# Patient Record
Sex: Female | Born: 1990 | Race: White | Hispanic: No | Marital: Married | Smoking: Former smoker
Health system: Southern US, Community
[De-identification: ages and names within clinical notes are randomized; demographics above are authoritative.]

## PROBLEM LIST (undated history)

## (undated) DIAGNOSIS — Z8679 Personal history of other diseases of the circulatory system: Secondary | ICD-10-CM

## (undated) DIAGNOSIS — I499 Cardiac arrhythmia, unspecified: Secondary | ICD-10-CM

## (undated) DIAGNOSIS — N764 Abscess of vulva: Secondary | ICD-10-CM

## (undated) DIAGNOSIS — E162 Hypoglycemia, unspecified: Secondary | ICD-10-CM

## (undated) DIAGNOSIS — E282 Polycystic ovarian syndrome: Secondary | ICD-10-CM

## (undated) DIAGNOSIS — G43909 Migraine, unspecified, not intractable, without status migrainosus: Secondary | ICD-10-CM

## (undated) DIAGNOSIS — Q211 Atrial septal defect, unspecified: Secondary | ICD-10-CM

## (undated) DIAGNOSIS — F419 Anxiety disorder, unspecified: Secondary | ICD-10-CM

## (undated) DIAGNOSIS — I4891 Unspecified atrial fibrillation: Secondary | ICD-10-CM

## (undated) DIAGNOSIS — O24419 Gestational diabetes mellitus in pregnancy, unspecified control: Secondary | ICD-10-CM

## (undated) HISTORY — DX: Unspecified atrial fibrillation: I48.91

## (undated) HISTORY — DX: Anxiety disorder, unspecified: F41.9

## (undated) HISTORY — PX: ASD REPAIR: SHX258

## (undated) HISTORY — DX: Personal history of other diseases of the circulatory system: Z86.79

## (undated) HISTORY — DX: Gestational diabetes mellitus in pregnancy, unspecified control: O24.419

## (undated) HISTORY — DX: Cardiac arrhythmia, unspecified: I49.9

## (undated) HISTORY — DX: Migraine, unspecified, not intractable, without status migrainosus: G43.909

## (undated) HISTORY — DX: Atrial septal defect: Q21.1

## (undated) HISTORY — DX: Abscess of vulva: N76.4

## (undated) HISTORY — DX: Polycystic ovarian syndrome: E28.2

## (undated) HISTORY — DX: Hypoglycemia, unspecified: E16.2

## (undated) HISTORY — PX: HYMENECTOMY: SHX987

## (undated) HISTORY — PX: APPENDECTOMY: SHX54

## (undated) HISTORY — DX: Atrial septal defect, unspecified: Q21.10

---

## 2015-03-05 DIAGNOSIS — F5101 Primary insomnia: Secondary | ICD-10-CM | POA: Insufficient documentation

## 2015-03-05 DIAGNOSIS — K219 Gastro-esophageal reflux disease without esophagitis: Secondary | ICD-10-CM | POA: Insufficient documentation

## 2017-07-01 DIAGNOSIS — Q211 Atrial septal defect, unspecified: Secondary | ICD-10-CM | POA: Insufficient documentation

## 2017-07-07 DIAGNOSIS — I480 Paroxysmal atrial fibrillation: Secondary | ICD-10-CM | POA: Insufficient documentation

## 2017-07-08 DIAGNOSIS — I4891 Unspecified atrial fibrillation: Secondary | ICD-10-CM | POA: Insufficient documentation

## 2019-07-10 LAB — OB RESULTS CONSOLE ANTIBODY SCREEN: Antibody Screen: NEGATIVE

## 2019-07-10 LAB — OB RESULTS CONSOLE GC/CHLAMYDIA
Chlamydia: NEGATIVE
Gonorrhea: NEGATIVE

## 2019-07-10 LAB — OB RESULTS CONSOLE HEPATITIS B SURFACE ANTIGEN: Hepatitis B Surface Ag: NEGATIVE

## 2019-07-10 LAB — OB RESULTS CONSOLE RUBELLA ANTIBODY, IGM: Rubella: IMMUNE

## 2019-07-10 LAB — OB RESULTS CONSOLE HIV ANTIBODY (ROUTINE TESTING): HIV: NONREACTIVE

## 2019-07-10 LAB — OB RESULTS CONSOLE ABO/RH: RH Type: POSITIVE

## 2019-07-10 LAB — OB RESULTS CONSOLE RPR: RPR: NONREACTIVE

## 2019-07-13 ENCOUNTER — Encounter (HOSPITAL_COMMUNITY): Payer: Self-pay | Admitting: *Deleted

## 2019-07-17 ENCOUNTER — Other Ambulatory Visit: Payer: Self-pay | Admitting: Obstetrics and Gynecology

## 2019-07-17 ENCOUNTER — Ambulatory Visit (HOSPITAL_COMMUNITY)
Payer: Federal, State, Local not specified - PPO | Attending: Obstetrics and Gynecology | Admitting: Obstetrics and Gynecology

## 2019-07-17 ENCOUNTER — Other Ambulatory Visit: Payer: Self-pay

## 2019-07-17 VITALS — Ht 65.25 in

## 2019-07-17 DIAGNOSIS — O99419 Diseases of the circulatory system complicating pregnancy, unspecified trimester: Secondary | ICD-10-CM

## 2019-07-17 DIAGNOSIS — O99411 Diseases of the circulatory system complicating pregnancy, first trimester: Secondary | ICD-10-CM | POA: Diagnosis not present

## 2019-07-17 DIAGNOSIS — Z3A13 13 weeks gestation of pregnancy: Secondary | ICD-10-CM

## 2019-07-17 DIAGNOSIS — I471 Supraventricular tachycardia: Secondary | ICD-10-CM | POA: Diagnosis not present

## 2019-07-17 DIAGNOSIS — I270 Primary pulmonary hypertension: Secondary | ICD-10-CM

## 2019-07-17 NOTE — Progress Notes (Signed)
Maternal-Fetal Medicine  Name: Jennifer Villanueva MRN: [id number] Requesting Provider: Dr. Tiana Loft, MD  I had the pleasure of seeing Jennifer Villanueva today at [facility]. She was accompanied by her husband. She is here for consultation because of maternal cardiac condition.  In May 2018, the patient "passed out" twice and was admitted to the local hospital for evaluation. Echocardiography showed pulmonary hypertension, patent foramen ovale (PFO)and increased right ventricular size. She had PFO repair in July 2018 and following surgery, she had atrial fibrillation. She had cardioversion (2018).  She is being followed by [facility] [place] (Cicero Duck, NP). She was seen by Ms. Britta Mccreedy on 07/13/19. Echocardiography performed on 11/13/2018 showed the following important findings: -The right ventricle has normal size and function. -LV size is normal. EF greater than 55%. -The tricuspid valve is structurally normal. There is moderate tricuspid insufficiency (2+). There is mild pulmonary hypertension with a RSVP of 36 mm Hg.   Patient reports she does NOT have pulmonary hypertension and that it has resolved. I spoke to Ms. Britta Mccreedy before consultation and she feels it has resolved after surgery. Echocardiography seems to conflict with the opinion.  Review of systems: Mild headaches off and on (less frequent migraines now), no visual distubances, no epistaxis or ear discharge or neck swelling (recent thyroid function is normal). No chest pain, occasional palpitations with quick resolution, no abdominal or joint pains, no vaginal bleeding, no recurrent urinary tract infections, no syncopal episodes. Patient is able to tolerate moderate activities without shortness of breath. She does not use treadmill and avoids climbing stairs.  PSH: PFO repair (2018), apeendectomy (2006), hymenectomy (1996) because of urinary complaints.  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 Allergies: Codeine (hives), ?triple antibiotic cream, coconut. Social: Ex-smoker (quit more than a year ago), does not drink alcohol or use drugs. She has been married since October 2018 and her husband is in good health.  Gyn: PCOS before pregnancy, had Mirena IUD removed with an aim to achieve pregnancy (approved by her cardiologist).  Ob history: G1 P0.   I counseled the patient on the following: Mild pulmonary hypertension: -I informed the patient that I must assume she has mild pulmonary hypertension based on echocardiography report. Although her cardiologist had mentioned it resolved, hypertensive changes could remain after corrective surgery (PFO repair). I strongly recommended a second opinion by another cardiologist to be sure about the diagnosis as pulmonary hypertension in pregnancy is associated with increased maternal mortality and morbidity.  -I informed her that my counseling on pulmonary hypertension will help her decide on her choice of an institution for pregnancy management and delivery.  -Discussed cardiac anatomy and circulation with help of google images.  -Pregnancy imposes additional burden on maternal cardiac function. There is increased stroke volume, cardiac output and later (second and third trimester) increased heart rate. Cardiac output further increases during labor. -Maternal plasma volume increases by about 40%.  -Explained in simple terms the changes that occur with pulmonary hypertension. Pulmonary vascular pressure increases with increase in cardiac output. The pulmonary vessels undergo thickening and are usually permanent.  -Causes include congenital heart defect including PFO (group 1).  -Increased risk of thrombosis and pulmonary embolism  occur.  -Pregnancy can lead to increased pulmonary pressure, pulmonary edema and death.  -Previous studies have quoted mortality  rate higher than 50%.  -Recent reviews showed mortality ranges from 10% to 25%.  -Risk of maternal death cannot be predicted on the severity of pulmonary hypertension. Mild pulmonary hypertension can also have worse outcomes. Overall, one would expect lower mortality rate with mild pulmonary hypertension (10%).  -ACOG and other professional societies ([place] advise against pregnancy in patients with pulmonary hypertension and that termination of pregnancy should be considered.  -Pulmonary hypertension is Class IV (pregnancy is contraindicated) under Modified WHO Pregnancy Risk Classification. -If pregnancy is continued, it should be closely monitored and managed by multidisciplinary team involving obstetrician, maternal-fetal medicine specialist, cardiologist, anesthesiologist (with cardiac experience)/ICU team.  -Targeted treatment includes use of prostanoids, calcium channel blockers, heparin and they may have to be started 1 to 3 months before expected delivery. I will defer this option to the cardiologist.  -Early planned delivery may be necessary in many cases (34 weeks or earlier) and cesarean section may have to be performed. In asymptomatic cases, the pregnancy may continue up to 37 weeks.  -General anesthesia is to be avoided and spinal/epidural anesthesia is safe.  -Case reviews have shown that most deaths occur in the postpartum period (>80%) and can take several weeks.   -Close observation including ICU admission may be necessary in some cases after delivery.  -Fortunately, the patient does not have co-morbid conditions including diabetes or hypertension.  Atrial fibrillation and Supraventricular tachycardia: Patient had only 2 episodes in 2018 of A-Fib after surgery. Last week, at her office visit, she had SVT. She feels well now. I discussed the  safety-profile of propranolol. Fetal growth restriction can be associated with this drug, but overall it can be safely administered in pregnancy.   It is unclear whether pregnancy increases the frequency of SVT and the studies are conflicting. However, if SVT occurs in pregnancy, they are most-likely to be symptomatic. Digoxin, metoprolol are commonly-used drugs that can be safely given in pregnancy. Flecainide and sotolol may be required in some cases and can be given in pregnancy.  IV metoprolol should be available during labor. Institutional protocol should be consulted. If SVT occurs and not amenable to vagal maneuvers, IV metoprolol 2.5 mg over 5 minutes can be given (not more than 5 mg). Cardiology consultation should be obtained and dosage regimen should be available for emergency use by obstetricians.  Medications: I discussed the safety profiles of medications. Amitriptyline is not well-studied in pregnancy. If alternative medication is not possible, it may be continued.  Patient agreed to have a second opinion by another cardiologist. I discussed with Dr. Amado NashAlmquist after consultation and she will make arrangements for cardiology consultation.  Patient seems keen to continue her pregnancy and is likely to firm up her decision after cardiology consultation. She expressed her wish to be transferred [facility] [place] if cannot deliver [facility].  Recommendations: -Cardiology (second opinion) consultation for diagnosis (or rule out) of pulmonary hypertension. -Decision to [facility] or transfer of care to be made after cardiology consultation. -Recommend prophylactic lovenox if pulmonary hypertension is confirmed. Lovenox 60 milligrams subcut once daily may be given till 36 weeks and then can be switched to unfractionated heparin. -Continue medications as per cardiologist's advice.  Thank you for your consult. Please do not hesitate to contact me if you have any questions or  concerns.   Consultation including face-to-face counseling: 50 min.

## 2019-07-19 ENCOUNTER — Ambulatory Visit (INDEPENDENT_AMBULATORY_CARE_PROVIDER_SITE_OTHER): Payer: Federal, State, Local not specified - PPO | Admitting: Cardiology

## 2019-07-19 ENCOUNTER — Encounter: Payer: Self-pay | Admitting: Cardiology

## 2019-07-19 ENCOUNTER — Other Ambulatory Visit: Payer: Self-pay

## 2019-07-19 ENCOUNTER — Ambulatory Visit: Payer: Self-pay | Admitting: Cardiology

## 2019-07-19 VITALS — BP 134/86 | HR 111 | Ht 65.0 in | Wt 260.0 lb

## 2019-07-19 DIAGNOSIS — I480 Paroxysmal atrial fibrillation: Secondary | ICD-10-CM | POA: Diagnosis not present

## 2019-07-19 DIAGNOSIS — Z8774 Personal history of (corrected) congenital malformations of heart and circulatory system: Secondary | ICD-10-CM

## 2019-07-19 DIAGNOSIS — R000 Tachycardia, unspecified: Secondary | ICD-10-CM | POA: Diagnosis not present

## 2019-07-19 DIAGNOSIS — Z3A12 12 weeks gestation of pregnancy: Secondary | ICD-10-CM | POA: Diagnosis not present

## 2019-07-19 NOTE — Progress Notes (Signed)
Primary Physician:  Petra KubaAlli, Foluke M, MD   Patient ID: Jennifer Villanueva League, female    DOB: 09/24/1991, 28 y.o.   MRN: [id number]  Subjective:    Chief Complaint  Patient presents with  . Hypertension  . New Patient (Initial Visit)    HPI: Jennifer Villanueva Nannini  is a 28 y.o. female  with paroxysmal atrial fibrillation/atrial flutter, s/p ASD repair in July 2018, questionable pulmonary hypertension, chronic migraine, currently [redacted] weeks pregnant, referred to us on STAT basis by her OBGYN, Dr. Amado NashAlmquist for second opinion on pulmonary hypertension.   She is currently followed by [facility] (Dr. Wende BushyKreshon and Georgia DuffMelissa Benson, NP). On 06/30/2017 patient [facility], found after episodes of syncope and underwent cardiac MRI noting her ASD. She had a uncomplicated postoperative course, except for a few days later developed A fib that required cardioversion on 07/08/2018. She has only had 2 episodes of A fib, no episodes for awhile. She was also evaluated by [place] in [place], that cleared her.  She had echocardiogram after her procedure revealing preserved LVEF, flattened septum consistent with RV pressure/volume overload.  No residual ASD shunt.  Right ventricle and right atrium were severely dilated along with moderate tricuspid regurgitation.  Sometime after this, she apparently developed A. fib.  She was on anticoagulation for a period of time that has been stopped for some time. She was loaded with amiodarone and underwent cardioversion on 07/08/2018; however, she eventually went back into atrial flutter/atrial fibrillation did not resolve with increased dose of metoprolol.  She was continued on amiodarone.  Metoprolol was switched to Inderal; however, required low dose due to hypotension.  Sometime later, amiodarone was discontinued and she was using propafenone for "pill in the pocket" approach.  She continued to have tachypalpitations with correlation on Holter monitor to sinus  tachycardia.  She is followed by cardiology and underwent echocardiogram in 2019 revealing normal LVEF, normal RV size and function.  Moderate TR and mild pulmonary hypertension with RSVP of 36 mmHg.  She recently had an episode of SVT in late July and was seen by her cardiologist.  Propanolol was changed to 3 times daily dosing for better rate control.  She was encouraged to not use Propafenone.  Due to concerns by her OB/GYN for pulmonary hypertension, she had requested second opinion.  She has not noticed any significant shortness of breath. She has been gaining weight since 2018 and is having her thyroid evaluated as well. She has insomnia, but no history of sleep apnea. No leg edema.   She previously on Propanolol 40 mg BID; however, had lightheadedness with this. She is now on Propanolol 20 mg TID that she tolerates well.   Past Medical History:  Diagnosis Date  . Anxiety   . ASD (atrial septal defect)   . Atrial fibrillation (HCC)   . History of pulmonary hypertension   . Hypoglycemia   . Migraine   . PCOS (polycystic ovarian syndrome)     Past Surgical History:  Procedure Laterality Date  . APPENDECTOMY    . ASD REPAIR    . HYMENECTOMY      Social History   Socioeconomic History  . Marital status: Married    Spouse name: Not on file  . Number of children: 0  . Years of education: Not on file  . Highest education level: Not on file  Occupational History  . Not on file  Social Needs  . Financial resource strain: Not on file  .  Food insecurity    Worry: Not on file    Inability: Not on file  . Transportation needs    Medical: Not on file    Non-medical: Not on file  Tobacco Use  . Smoking status: Former Smoker    Packs/day: 0.50    Years: 3.00    Pack years: 1.50    Types: Cigarettes    Quit date: 2018    Years since quitting: 2.6  . Smokeless tobacco: Never Used  Substance and Sexual Activity  . Alcohol use: Not Currently  . Drug use: Never  . Sexual  activity: Not on file  Lifestyle  . Physical activity    Days per week: Not on file    Minutes per session: Not on file  . Stress: Not on file  Relationships  . Social Herbalist on phone: Not on file    Gets together: Not on file    Attends religious service: Not on file    Active member of club or organization: Not on file    Attends meetings of clubs or organizations: Not on file    Relationship status: Not on file  . Intimate partner violence    Fear of current or ex partner: Not on file    Emotionally abused: Not on file    Physically abused: Not on file    Forced sexual activity: Not on file  Other Topics Concern  . Not on file  Social History Narrative  . Not on file    Review of Systems  Constitution: Positive for weight gain. Negative for decreased appetite, malaise/fatigue and weight loss.  Eyes: Negative for visual disturbance.  Cardiovascular: Negative for chest pain, claudication, dyspnea on exertion, leg swelling, orthopnea, palpitations and syncope.  Respiratory: Negative for hemoptysis and wheezing.   Endocrine: Negative for cold intolerance and heat intolerance.  Hematologic/Lymphatic: Does not bruise/bleed easily.  Skin: Negative for nail changes.  Musculoskeletal: Negative for muscle weakness and myalgias.  Gastrointestinal: Negative for abdominal pain, change in bowel habit, nausea and vomiting.  Neurological: Negative for difficulty with concentration, dizziness, focal weakness and headaches.  Psychiatric/Behavioral: Negative for altered mental status and suicidal ideas.  All other systems reviewed and are negative.     Objective:  Blood pressure 134/86, pulse (!) 111, height 5\' 5"  (1.651 m), weight 260 lb (117.9 kg), SpO2 100 %. Body mass index is 43.27 kg/m.    Physical Exam  Constitutional: She is oriented to person, place, and time. Vital signs are normal. She appears well-developed and well-nourished.  HENT:  Head: Normocephalic and  atraumatic.  Neck: Normal range of motion.  Cardiovascular: Normal rate, regular rhythm, normal heart sounds and intact distal pulses.  Pulmonary/Chest: Effort normal and breath sounds normal. No accessory muscle usage. No respiratory distress.  Abdominal: Soft. Bowel sounds are normal.  Musculoskeletal: Normal range of motion.  Neurological: She is alert and oriented to person, place, and time.  Skin: Skin is warm and dry.  Vitals reviewed.  Radiology: No results found.  Laboratory examination:    No flowsheet data found. No flowsheet data found. Lipid Panel  No results found for: CHOL, TRIG, HDL, CHOLHDL, VLDL, LDLCALC, LDLDIRECT HEMOGLOBIN A1C No results found for: HGBA1C, MPG TSH No results for input(s): TSH in the last 8760 hours.  PRN Meds:. There are no discontinued medications. Current Meds  Medication Sig  . amitriptyline (ELAVIL) 75 MG tablet Take 75 mg by mouth at bedtime.  Marland Kitchen aspirin 81 MG chewable  tablet Chew 81 mg by mouth 2 (two) times daily.   Marland Kitchen. loratadine (CLARITIN) 10 MG tablet Take 10 mg by mouth daily.  . Omeprazole (PRILOSEC PO) Take 20 mg by mouth 2 (two) times daily.   . Prenatal Vit-Fe Fumarate-FA (MULTIVITAMIN-PRENATAL) 27-0.8 MG TABS tablet Take 1 tablet by mouth daily at 12 noon.  . propranolol (INDERAL) 20 MG tablet Take 20 mg by mouth 3 (three) times daily.    Cardiac Studies:   48 hour holter 05/21/2019: There were 280174 QRS complexes detected. Of these, there were 2(<1%) ventricular ectopic beats and 32(<1%) supraventricular ectopic beats. The average heart rate was 99 bpm. The heart rate ranged from 63 bpm to 154 bpm. The R-R ranged from 244 msec to 1576 msec. There were 275 sustained tachycardia episodes with total duration 1096.2(38.6%) minutes. The fastest tachy was 125 bpm. The longest tachy was 131 minutes. Preliminary technician interpretation: Sinus rhythm to sinus tachycardia with sinus arrhythmia. No diary events entered    Echo  11/11/2017: Fair quality study with normal LV systolic function, mild RV enlargement, no significant valvular disease, ASD repair appears to be intact  Assessment:     ICD-10-CM   1. Sinus tachycardia  R00.0   2. Paroxysmal atrial fibrillation (HCC)  I48.0   3. H/O congenital atrial septal defect (ASD) repair  Z87.74 EKG 12-Lead    PCV ECHOCARDIOGRAM COMPLETE W BUBBLE  4. [redacted] weeks gestation of pregnancy  Z3A.12     EKG 07/19/2019: Sinus tachycardia at 105 bpm, normal axis, IRBBB.       Recommendations:   She has not had any recurrence of A fib in the last 1-2 years; therefore, I do not feel that management would change. She reports episode of SVT while in her cardiologist office on 07/30; however, on reading their EKG interpretation, sinus tachycardia was noted. If she did actually have SVT, she may need antiarrhythmic therapy.   Less likely to be primary pulmonary hypertension. I suspect her pulmonary hypertension is secondary from congenital heart defect. She also has other reasons for pulmonary hypertension including her weight. She will continue with evaluation for thyroid disorder. Suspect may also be contributed by her propanolol.Discussed diet changes to help with this, encouraged her to be careful with her diet to avoid excessive weight gain during pregnancy.  If Pulm HTN is truly there and anything more than mild, do feel that she will need evaluation and management from tertiary center [facility] [place]. Patient is agreeable to this. I will certainly keep Dr. Wende BushyKreshon and his NP informed as well, and if she needs referral, I will defer to them as she has an established relationship with them. Symptomatically she is doing well. No clinical evidence of heart failure by exam. I will obtain echocardiogram with bubble study. If found to have residual shunting, will consider cardiac MRI. I will see her back in 4 weeks for follow up.    *I have discussed this case with Dr. Rosemary HolmsPatwardhan  and he personally examined the patient and participated in formulating the plan.*    Toniann FailAshton Haynes Blessing Ozga, MSN, APRN, FNP-C [facility] Office: [phone] Fax: [phone]

## 2019-08-02 ENCOUNTER — Other Ambulatory Visit: Payer: Self-pay

## 2019-08-02 ENCOUNTER — Ambulatory Visit (INDEPENDENT_AMBULATORY_CARE_PROVIDER_SITE_OTHER): Payer: Federal, State, Local not specified - PPO

## 2019-08-02 DIAGNOSIS — Z8774 Personal history of (corrected) congenital malformations of heart and circulatory system: Secondary | ICD-10-CM | POA: Diagnosis not present

## 2019-08-02 DIAGNOSIS — Z0189 Encounter for other specified special examinations: Secondary | ICD-10-CM | POA: Diagnosis not present

## 2019-08-09 NOTE — Telephone Encounter (Signed)
Spoke with patient she is aware of results but she says she cant see it online

## 2019-08-17 ENCOUNTER — Other Ambulatory Visit: Payer: Self-pay

## 2019-08-17 ENCOUNTER — Telehealth (INDEPENDENT_AMBULATORY_CARE_PROVIDER_SITE_OTHER): Payer: Federal, State, Local not specified - PPO | Admitting: Cardiology

## 2019-08-17 VITALS — HR 100 | Ht 65.0 in | Wt 258.0 lb

## 2019-08-17 DIAGNOSIS — Z3A17 17 weeks gestation of pregnancy: Secondary | ICD-10-CM | POA: Diagnosis not present

## 2019-08-17 DIAGNOSIS — Z8774 Personal history of (corrected) congenital malformations of heart and circulatory system: Secondary | ICD-10-CM

## 2019-08-17 DIAGNOSIS — I480 Paroxysmal atrial fibrillation: Secondary | ICD-10-CM | POA: Diagnosis not present

## 2019-08-17 DIAGNOSIS — R000 Tachycardia, unspecified: Secondary | ICD-10-CM

## 2019-08-17 NOTE — Progress Notes (Signed)
Primary Physician:  Kasandra Knudsen, MD   Patient ID: Thea Gist, female    DOB: Jul 24, 1991, 28 y.o.   MRN: [id number]  Subjective:    Chief Complaint  Patient presents with   Results    echo results   This visit type was conducted due to national recommendations for restrictions regarding the COVID-19 Pandemic (e.g. social distancing).  This format is felt to be most appropriate for this patient at this time.  All issues noted in this document were discussed and addressed.  No physical exam was performed (except for noted visual exam findings with Telehealth visits).  The patient has consented to conduct a Telehealth visit and understands insurance will be billed.   I discussed the limitations of evaluation and management by telemedicine and the availability of in person appointments. The patient expressed understanding and agreed to proceed.  Virtual Visit via Video Note is as below  I connected with Mrs. Eyerman, on 08/17/19 at 1430 by a video enabled telemedicine application and verified that I am speaking with the correct person using two identifiers.     I have discussed with her regarding the safety during COVID Pandemic and steps and precautions including social distancing with the patient.    HPI: Ellar Hakala  is a 28 y.o. female  with paroxysmal atrial fibrillation/atrial flutter, s/p ASD repair in July 2018, questionable pulmonary hypertension, chronic migraine, currently [redacted] weeks pregnant, recently evaluated by [place] for second opinion on pulmonary hypertension.   She is currently followed by [facility] (Dr. Venia Minks and Cicero Duck, NP). On 06/30/2017 patient underwent surgical ASD repair at [facility], found after episodes of syncope and underwent cardiac MRI noting her ASD. She had a uncomplicated postoperative course, except for a few days later developed A fib that required cardioversion on 07/08/2018. She has only had 2 episodes of A fib, no episodes for awhile.  She was also evaluated by EP in [place], that cleared her.  She had echocardiogram after her procedure revealing preserved LVEF, flattened septum consistent with RV pressure/volume overload.  No residual ASD shunt.  Right ventricle and right atrium were severely dilated along with moderate tricuspid regurgitation.  Sometime after this, she apparently developed A. fib.  She was on anticoagulation for a period of time that has been stopped for some time. She was loaded with amiodarone and underwent cardioversion on 07/08/2018; however, she eventually went back into atrial flutter/atrial fibrillation did not resolve with increased dose of metoprolol.  She was continued on amiodarone.  Metoprolol was switched to Inderal; however, required low dose due to hypotension.  Sometime later, amiodarone was discontinued and she was using propafenone for "pill in the pocket" approach.  She continued to have tachypalpitations with correlation on Holter monitor to sinus tachycardia.  She is followed by cardiology and underwent echocardiogram in 2019 revealing normal LVEF, normal RV size and function.  Moderate TR and mild pulmonary hypertension with RSVP of 36 mmHg.  She recently had an episode of SVT in late July and was seen by her cardiologist.  Propanolol was changed to 3 times daily dosing for better rate control.  She was encouraged to not use Propafenone.  Due to concerns by her OB/GYN for pulmonary hypertension, we were requested to see the patient. She underwent echocardiogram and now presents for follow up.  She states that she is doing well, no complications with pregnancy thus far. She was found to have gestational diabetes that she is able to control with diet. She  has not had any shortness of breath. Heart rate has been stable in the 90-100 range. No palpitations. She has insomnia, but no history of sleep apnea. No leg edema. Reports that she is actually losing weight during her pregnancy.   She previously on  Propanolol 40 mg BID; however, had lightheadedness with this. She is now on Propanolol 20 mg TID that she tolerates well.   Past Medical History:  Diagnosis Date   Anxiety    ASD (atrial septal defect)    Atrial fibrillation (HCC)    History of pulmonary hypertension    Hypoglycemia    Migraine    PCOS (polycystic ovarian syndrome)     Past Surgical History:  Procedure Laterality Date   APPENDECTOMY     ASD REPAIR     HYMENECTOMY      Social History   Socioeconomic History   Marital status: Married    Spouse name: Not on file   Number of children: 0   Years of education: Not on file   Highest education level: Not on file  Occupational History   Not on file  Social Needs   Financial resource strain: Not on file   Food insecurity    Worry: Not on file    Inability: Not on file   Transportation needs    Medical: Not on file    Non-medical: Not on file  Tobacco Use   Smoking status: Former Smoker    Packs/day: 0.50    Years: 3.00    Pack years: 1.50    Types: Cigarettes    Quit date: 2018    Years since quitting: 2.6   Smokeless tobacco: Never Used  Substance and Sexual Activity   Alcohol use: Not Currently   Drug use: Never   Sexual activity: Not on file  Lifestyle   Physical activity    Days per week: Not on file    Minutes per session: Not on file   Stress: Not on file  Relationships   Social connections    Talks on phone: Not on file    Gets together: Not on file    Attends religious service: Not on file    Active member of club or organization: Not on file    Attends meetings of clubs or organizations: Not on file    Relationship status: Not on file   Intimate partner violence    Fear of current or ex partner: Not on file    Emotionally abused: Not on file    Physically abused: Not on file    Forced sexual activity: Not on file  Other Topics Concern   Not on file  Social History Narrative   Not on file    Review  of Systems  Constitution: Positive for weight loss. Negative for decreased appetite, malaise/fatigue and weight gain.  Eyes: Negative for visual disturbance.  Cardiovascular: Negative for chest pain, claudication, dyspnea on exertion, leg swelling, orthopnea, palpitations and syncope.  Respiratory: Negative for hemoptysis and wheezing.   Endocrine: Negative for cold intolerance and heat intolerance.  Hematologic/Lymphatic: Does not bruise/bleed easily.  Skin: Negative for nail changes.  Musculoskeletal: Negative for muscle weakness and myalgias.  Gastrointestinal: Negative for abdominal pain, change in bowel habit, nausea and vomiting.  Neurological: Negative for difficulty with concentration, dizziness, focal weakness and headaches.  Psychiatric/Behavioral: Negative for altered mental status and suicidal ideas.  All other systems reviewed and are negative.     Objective:  Pulse 100, height 5\' 5"  (1.651  m), weight 258 lb (117 kg). Body mass index is 42.93 kg/m.    Physical Exam  Constitutional: She is oriented to person, place, and time. Vital signs are normal. She appears well-developed and well-nourished.  HENT:  Head: Normocephalic and atraumatic.  Neck: Normal range of motion.  Cardiovascular: Normal rate, regular rhythm, normal heart sounds and intact distal pulses.  Pulmonary/Chest: Effort normal and breath sounds normal. No accessory muscle usage. No respiratory distress.  Abdominal: Soft. Bowel sounds are normal.  Musculoskeletal: Normal range of motion.  Neurological: She is alert and oriented to person, place, and time.  Skin: Skin is warm and dry.  Vitals reviewed.  Radiology: No results found.  Laboratory examination:    No flowsheet data found. No flowsheet data found. Lipid Panel  No results found for: CHOL, TRIG, HDL, CHOLHDL, VLDL, LDLCALC, LDLDIRECT HEMOGLOBIN A1C No results found for: HGBA1C, MPG TSH No results for input(s): TSH in the last 8760  hours.  PRN Meds:. There are no discontinued medications. Current Meds  Medication Sig   amitriptyline (ELAVIL) 75 MG tablet Take 75 mg by mouth at bedtime.   aspirin 81 MG chewable tablet Chew 81 mg by mouth 2 (two) times daily.    loratadine (CLARITIN) 10 MG tablet Take 10 mg by mouth daily.   Omeprazole (PRILOSEC PO) Take 20 mg by mouth 2 (two) times daily.    Prenatal Vit-Fe Fumarate-FA (MULTIVITAMIN-PRENATAL) 27-0.8 MG TABS tablet Take 1 tablet by mouth daily at 12 noon.   propranolol (INDERAL) 20 MG tablet Take 20 mg by mouth 3 (three) times daily.    Cardiac Studies:   48 hour holter 05/21/2019: There were 280174 QRS complexes detected. Of these, there were 2(<1%) ventricular ectopic beats and 32(<1%) supraventricular ectopic beats. The average heart rate was 99 bpm. The heart rate ranged from 63 bpm to 154 bpm. The R-R ranged from 244 msec to 1576 msec. There were 275 sustained tachycardia episodes with total duration 1096.2(38.6%) minutes. The fastest tachy was 125 bpm. The longest tachy was 131 minutes. Preliminary technician interpretation: Sinus rhythm to sinus tachycardia with sinus arrhythmia. No diary events entered    Echo- 08/02/2019 1. Normal LV systolic function with EF 57%. Left ventricle cavity is normal in size. Normal global wall motion. Calculated EF 57%. 2. Left atrial cavity is mild to moderately dilated at 4.3 cm. Pt. is s/p ASD repair, there is minimal to mild residual shunt by color doppler study. Suggest bubble study or other imaging modality for further evaluation as clinically indicated. 3. Right ventricle cavity is mildly dilated. Normal right ventricular function. 4. Mild (Grade I) mitral regurgitation. 5. Mild tricuspid regurgitation. No evidence of pulmonary hypertension. Approx. PA systolic pressure is 27 mm of Hg. Assessment:     ICD-10-CM   1. Sinus tachycardia  R00.0   2. H/O congenital atrial septal defect (ASD) repair  Z87.74    3. Paroxysmal atrial fibrillation (HCC)  I48.0   4. [redacted] weeks gestation of pregnancy  Z3A.17     EKG 07/19/2019: Sinus tachycardia at 105 bpm, normal axis, IRBBB.       Recommendations:   Patient presents for follow up to discuss echocardiogram results. ASD repair appears intact with no shunting noted. Flow noted at the septum is normal IVC flow and does not depict interatrial shunting. Patient has mild mitral and mild tricuspid regurgitation, which is benign finding. Pulmonary artery pressure is 27 mmHg, which is reduced from previous echocardiogram in 11/2018, and is normal. No evidence  of pulmonary hypertension. She reports that her primary cardiologist is planning echocardiogram at 6 months gestation for follow up and surveillance.   She does continue to have borderline tachycardia with Propanolol, but unable to tolerate higher dose due to soft BP. Encouraged adequate hydration and exercise that can actually help with her heart rate. She has had uncomplicated pregnancy thus far, but was recently found to have gestational diabetes that she is able to manage with diet.   She has not had any recurrence of A fib in the last 1-2 years; therefore, I do not feel that management would change. She reports episode of SVT while in her cardiologist office on 07/30; however, on reading their EKG interpretation, sinus tachycardia was noted. If she did actually have SVT, she may need antiarrhythmic therapy. Will defer to her primary cardiology team at [facility]. Will see her back on a as needed basis for any new or worsening problems.   *I have discussed this case with Dr. Rosemary Holms and he personally examined the patient and participated in formulating the plan.*    Toniann Fail, MSN, APRN, FNP-C [facility] Office: [phone] Fax: [phone]

## 2019-09-24 ENCOUNTER — Other Ambulatory Visit: Payer: Self-pay | Admitting: Obstetrics and Gynecology

## 2019-09-25 ENCOUNTER — Other Ambulatory Visit: Payer: Self-pay | Admitting: Obstetrics and Gynecology

## 2019-10-16 ENCOUNTER — Other Ambulatory Visit: Payer: Self-pay | Admitting: Obstetrics and Gynecology

## 2019-10-17 ENCOUNTER — Other Ambulatory Visit (HOSPITAL_COMMUNITY): Payer: Self-pay | Admitting: Obstetrics and Gynecology

## 2019-10-17 DIAGNOSIS — Z363 Encounter for antenatal screening for malformations: Secondary | ICD-10-CM

## 2019-11-02 ENCOUNTER — Other Ambulatory Visit: Payer: Self-pay

## 2019-11-02 ENCOUNTER — Ambulatory Visit (HOSPITAL_COMMUNITY): Payer: Federal, State, Local not specified - PPO

## 2019-11-02 ENCOUNTER — Ambulatory Visit (HOSPITAL_COMMUNITY)
Admission: RE | Admit: 2019-11-02 | Discharge: 2019-11-02 | Disposition: A | Discharge status: Home / Self Care | Payer: Federal, State, Local not specified - PPO | Source: Ambulatory Visit | Attending: Obstetrics and Gynecology | Admitting: Obstetrics and Gynecology

## 2019-11-02 ENCOUNTER — Ambulatory Visit (HOSPITAL_COMMUNITY): Payer: Federal, State, Local not specified - PPO | Admitting: *Deleted

## 2019-11-02 ENCOUNTER — Encounter (HOSPITAL_COMMUNITY): Payer: Self-pay

## 2019-11-02 VITALS — BP 139/81 | HR 109 | Temp 97.6°F

## 2019-11-02 DIAGNOSIS — Z363 Encounter for antenatal screening for malformations: Secondary | ICD-10-CM | POA: Diagnosis not present

## 2019-11-02 DIAGNOSIS — O24410 Gestational diabetes mellitus in pregnancy, diet controlled: Secondary | ICD-10-CM | POA: Diagnosis not present

## 2019-11-02 DIAGNOSIS — O99213 Obesity complicating pregnancy, third trimester: Secondary | ICD-10-CM | POA: Diagnosis not present

## 2019-11-02 DIAGNOSIS — Z3A28 28 weeks gestation of pregnancy: Secondary | ICD-10-CM

## 2019-11-02 DIAGNOSIS — O288 Other abnormal findings on antenatal screening of mother: Secondary | ICD-10-CM

## 2019-11-02 NOTE — Consult Note (Addendum)
Maternal-Fetal Medicine (Follow-up Consultation)  Name: Jennifer Villanueva MRN: [id number] Requesting Provider: Dr. Tiana Loft, MD  I had the pleasure of seeing Ms. Finger today at the [facility] for maternal fetal care.  She had MFM consultation on 07/17/2019 because of maternal cardiac disease.  After our consultation, the patient was seen by cardiologist in [facility] (Dr. Einar Gip).  Maternal echocardiography performed on 08/03/2019 showed normal ejection fraction and no evidence of pulmonary hypertension.  Patient does not have symptoms of shortness of breath or chest pain.  On screening, she has gestational diabetes and patient reports that her diabetes is well controlled on diet. Her prenatal course has, otherwise, been uneventful.  On cell free fetal DNA screening, the risks of fetal aneuploidies are not increased.  MSAFP screening showed low risk for open neural tube defects.  On today's ultrasound, amniotic fluid is normal good fetal activity seen.  Fetal growth is appropriate for gestational age.  Fetal anatomy appears normal but limited by advanced gestational age.  I reassured the patient of today's ultrasound findings.  I counseled the patient on the following: Maternal cardiac condition: In the absence of pulmonary hypertension, we would expect good maternal outcomes.  I do not expect third trimester or labor complications.  Patient can have vaginal delivery and early epidural analgesia may be helpful.  Adequate descent of the head should be allowed to achieve vaginal delivery forceps or vacuum should not be applied routinely. It is reasonable to deliver at 39 to 40 weeks. Patient has a follow-up with her cardiologist and for a repeat echocardiography.  Gestational diabetes: I briefly discussed diabetes in pregnancy.  Well-controlled diabetes not associated with adverse fetal or neonatal outcomes.  If blood glucose levels are normal, she can check her glucose levels twice a week  instead of daily.  Fetal growth assessment should be performed every 4 weeks till delivery.  Recommendations: -Follow-up with cardiologist. -Fetal growth assessment every 4 weeks till delivery. -Delivery at [facility]. -Early epidural analgesia. -Postpartum screening for type 2 diabetes.  Thank you for your consult. Please do not hesitate to contact me if you have any questions or concerns.  Consultation including face-to-face counseling: 30 min

## 2019-12-11 ENCOUNTER — Other Ambulatory Visit: Payer: Self-pay | Admitting: Obstetrics and Gynecology

## 2020-01-02 ENCOUNTER — Encounter (HOSPITAL_COMMUNITY): Payer: Self-pay | Admitting: Obstetrics and Gynecology

## 2020-01-02 ENCOUNTER — Inpatient Hospital Stay (HOSPITAL_COMMUNITY)
Admission: AD | Admit: 2020-01-02 | Discharge: 2020-01-02 | Disposition: A | Discharge status: Home / Self Care | Payer: Federal, State, Local not specified - PPO | Attending: Obstetrics and Gynecology | Admitting: Obstetrics and Gynecology

## 2020-01-02 DIAGNOSIS — Z3A36 36 weeks gestation of pregnancy: Secondary | ICD-10-CM | POA: Diagnosis not present

## 2020-01-02 DIAGNOSIS — Z3689 Encounter for other specified antenatal screening: Secondary | ICD-10-CM | POA: Insufficient documentation

## 2020-01-02 DIAGNOSIS — Z3A37 37 weeks gestation of pregnancy: Secondary | ICD-10-CM | POA: Diagnosis not present

## 2020-01-02 NOTE — Discharge Instructions (Signed)
Fetal Monitoring Overview Monitoring your developing baby (fetus) before birth can identify potential problems for you and your baby. Fetal monitoring can:  Help prevent serious problems from developing.  Guide health care providers in how best to care for your unborn baby.  Check your unborn baby's overall health. The amount of monitoring and the specific tests that will be done will vary. It will depend on whether your pregnancy is considered to be low or high risk. For example, you may need certain tests if you have a medical problem that can put your unborn baby at risk. Health care providers use several techniques and tests to monitor your baby before birth. No single test is perfectly accurate, and you may need to follow up with your health care provider if there are any concerns. Fetal movement counts When you are past 20 weeks of pregnancy, you may feel your baby move. As your baby gets bigger, these movements are easier to feel. A fetal movement count is when you count the number of movements (kicks, flutters, swishes, rolls, or jabs) over a specific period of time. You record the number and report it to your health care provider. This is often recommended in high-risk pregnancies, but it is good for every pregnant woman to do. Your health care provider may ask you to start counting fetal movements at 28 weeks of pregnancy. Non-stress test The non-stress test:  Evaluates fetal heartbeat: ? While your baby is at rest. ? While your baby is moving.  Is often done as part of a set of tests called the biophysical profile.  May show signs that it is time for your baby to be delivered. The heart rate in a healthy baby will speed up when the baby moves or kicks. The heart rate will decrease at rest, and the peaks or accelerations of the heart rate will be lower. If the test brings up any questions or concerns, you may need to have more testing. This test may be done if your pregnancy goes  past your due date. It is also commonly done in high-risk pregnancies beginning at 32 weeks. Biophysical profile The biophysical profile is a set of tests that are done to find out how well the baby is doing. It includes the non-stress test along with imaging tests that use sound waves (ultrasound) to create images of your baby. In a biophysical profile, the following tests are done and scored:  Fetal heartbeat.  Fetal breathing.  Fetal movements.  Fetal muscle tone.  Amount of amniotic fluid. Each test gets a score of either 2 (normal) or 0 (abnormal). The scores are then added together for a total score. A low score may mean that you and your baby need additional monitoring or special care. Sometimes your health care provider may recommend that you deliver early. This test is usually done after 32 weeks of pregnancy. It can be done sooner, if needed. Contraction stress test A contraction stress test monitors the heart rate of your baby during contractions. The test checks to see how your baby will tolerate the stress of labor. Health care providers use this test to further evaluate your baby when other tests, such as the biophysical profile, have shown that there may be a problem. This test may be done:  Any time you are in labor.  Between weeks 32 and 34 of your pregnancy.  Later, if necessary. Modified biophysical profile A modified biophysical profile is a two-part test. You will have:  An ultrasound exam,   to check how much fluid is surrounding your baby inside of your womb (amniotic fluid).  A non-stress test, to check your baby's heart rate. The results will determine whether a full biophysical profile may be needed. This test is usually done late in your final 3 months (third trimester) of pregnancy. Umbilical artery Doppler velocimetry Umbilical artery Doppler velocimetry uses sound waves to measure the flow of blood between you and your baby. This test:  Measures the amount  of blood flow through the cord that attaches your baby to your womb (umbilical cord).  Measures the speed of the blood flow. You likely only need this test to check your baby's condition if you have a high-risk pregnancy. All types of monitoring aim to protect your health and that of your baby. The best way to have a healthy pregnancy and a healthy baby is to learn as much as you can about your pregnancy and to work closely with all your health care providers. This information is not intended to replace advice given to you by your health care provider. Make sure you discuss any questions you have with your health care provider. Document Revised: 08/23/2017 Document Reviewed: 06/28/2016 Elsevier Patient Education  2020 Elsevier Inc.  

## 2020-01-02 NOTE — MAU Provider Note (Signed)
History     CSN: [id number]  Arrival date and time: 01/02/20 1651   First Provider Initiated Contact with Patient 01/02/20 1824      Chief Complaint  Patient presents with  . fetal monitoring   HPI  Ms.  Jennifer Villanueva is a 29 y.o. year old G1P0 female at [redacted]w[redacted]d weeks gestation who was sent to MAU for further CEFM for a suspected fetal heart rate deceleration on an in-office NST. She seems to think "the baby moved while being monitored and the tracing wasn't picking up on his heartbeat at times." She denies any pain, VB, or LOF. She was having a NST in the office d/t GDM. She receives [facility] with [facility]. Her next scheduled appt is on 01/11/2020.  Past Medical History:  Diagnosis Date  . Anxiety   . ASD (atrial septal defect)   . Atrial fibrillation (HCC)   . History of pulmonary hypertension   . Hypoglycemia   . Migraine   . PCOS (polycystic ovarian syndrome)     Past Surgical History:  Procedure Laterality Date  . APPENDECTOMY    . ASD REPAIR    . HYMENECTOMY      History reviewed. No pertinent family history.  Social History   Tobacco Use  . Smoking status: Former Smoker    Packs/day: 0.50    Years: 3.00    Pack years: 1.50    Types: Cigarettes    Quit date: 2018    Years since quitting: 3.0  . Smokeless tobacco: Never Used  Substance Use Topics  . Alcohol use: Not Currently  . Drug use: Never    Allergies:  Allergies  Allergen Reactions  . Cephalosporins   . Coconut Flavor   . Codeine   . Triple Antibiotic [Bacitracin-Neomycin-Polymyxin]     Medications Prior to Admission  Medication Sig Dispense Refill Last Dose  . aspirin 81 MG chewable tablet Chew 81 mg by mouth 2 (two) times daily.    01/02/2020 at Unknown time  . clindamycin (CLEOCIN) 300 MG capsule Take 300 mg by mouth daily.   01/02/2020 at Unknown time  . loratadine (CLARITIN) 10 MG tablet Take 10 mg by mouth daily.   01/02/2020 at Unknown time  . Pantoprazole Sodium (PROTONIX PO) Take by mouth.    01/02/2020 at Unknown time  . Prenatal Vit-Fe Fumarate-FA (MULTIVITAMIN-PRENATAL) 27-0.8 MG TABS tablet Take 1 tablet by mouth daily at 12 noon.   01/02/2020 at Unknown time  . propranolol (INDERAL) 20 MG tablet Take 20 mg by mouth 3 (three) times daily.   01/02/2020 at Unknown time  . amitriptyline (ELAVIL) 75 MG tablet Take 75 mg by mouth at bedtime.     . Omeprazole (PRILOSEC PO) Take 20 mg by mouth 2 (two) times daily.        Review of Systems  Constitutional: Negative.   HENT: Negative.   Eyes: Negative.   Respiratory: Negative.   Cardiovascular: Negative.   Gastrointestinal: Negative.   Endocrine: Negative.   Genitourinary: Negative.   Musculoskeletal: Negative.   Skin: Negative.   Allergic/Immunologic: Negative.   Neurological: Negative.   Hematological: Negative.   Psychiatric/Behavioral: Negative.    Physical Exam   Blood pressure 123/73, pulse 88, temperature 99 F (37.2 C), resp. rate 16, height 5\' 5"  (1.651 m), weight 117.5 kg, SpO2 99 %.  Physical Exam  Nursing note and vitals reviewed. Constitutional: She is oriented to person, place, and time. She appears well-developed and well-nourished.  HENT:  Head: Normocephalic and atraumatic.  Eyes: Pupils are equal, round, and reactive to light.  Cardiovascular: Normal rate.  Respiratory: Effort normal.  GI: Soft.  Genitourinary:    Genitourinary Comments: Pelvic not indicated   Musculoskeletal:        General: Normal range of motion.     Cervical back: Normal range of motion.  Neurological: She is alert and oriented to person, place, and time.  Skin: Skin is warm and dry.  Psychiatric: She has a normal mood and affect. Her behavior is normal. Judgment and thought content normal.   NST - FHR: 140 bpm / moderate variability / accels present / decels absent / TOCO: Occ UI Noted, but no patient perception of them  MAU Course  Procedures  MDM CEFM  Assessment and Plan  NST (non-stress test) reactive on fetal  surveillance - Information provided on fetal monitoring  - Reassurance given that FM is very active - Advised to keep scheduled appointment on 01/11/2020 - Note faxed to [place] office - Discharge home - Patient verbalized an understanding of the plan of care and agrees.   Laury Deep, MSN, CNM 01/02/2020, 6:24 PM

## 2020-01-02 NOTE — MAU Note (Signed)
.   Jennifer Villanueva is a 29 y.o. at [redacted]w[redacted]d here in MAU reporting: she was sent from office for fetal monitoring due to dcel in the office. Pt states she thinks he moved and was not tracing at times at the office. Denies any Pain LMP:  Onset of complaint: office today Pain score: 0 Vitals:   01/02/20 1713  BP: 123/73  Pulse: 88  Resp: 16  Temp: 99 F (37.2 C)  SpO2: 99%     FHT:150 Lab orders placed from triage:

## 2020-01-07 LAB — OB RESULTS CONSOLE GBS: GBS: POSITIVE

## 2020-01-14 ENCOUNTER — Encounter (HOSPITAL_COMMUNITY): Payer: Self-pay | Admitting: *Deleted

## 2020-01-14 ENCOUNTER — Telehealth (HOSPITAL_COMMUNITY): Payer: Self-pay | Admitting: *Deleted

## 2020-01-14 NOTE — Telephone Encounter (Signed)
Preadmission screen  

## 2020-01-18 ENCOUNTER — Other Ambulatory Visit: Payer: Self-pay | Admitting: Obstetrics and Gynecology

## 2020-01-19 ENCOUNTER — Other Ambulatory Visit (HOSPITAL_COMMUNITY)
Admission: RE | Admit: 2020-01-19 | Discharge: 2020-01-19 | Disposition: A | Discharge status: Home / Self Care | Payer: Federal, State, Local not specified - PPO | Source: Ambulatory Visit | Attending: Obstetrics and Gynecology | Admitting: Obstetrics and Gynecology

## 2020-01-19 DIAGNOSIS — Z01812 Encounter for preprocedural laboratory examination: Secondary | ICD-10-CM | POA: Insufficient documentation

## 2020-01-19 DIAGNOSIS — Z20822 Contact with and (suspected) exposure to covid-19: Secondary | ICD-10-CM | POA: Insufficient documentation

## 2020-01-19 LAB — SARS CORONAVIRUS 2 (TAT 6-24 HRS): SARS Coronavirus 2: NEGATIVE

## 2020-01-20 ENCOUNTER — Other Ambulatory Visit: Payer: Self-pay

## 2020-01-21 ENCOUNTER — Encounter (HOSPITAL_COMMUNITY): Payer: Self-pay | Admitting: Obstetrics and Gynecology

## 2020-01-21 ENCOUNTER — Inpatient Hospital Stay (HOSPITAL_COMMUNITY)
Admission: RE | Admit: 2020-01-21 | Discharge: 2020-01-24 | DRG: 786 | Disposition: A | Discharge status: Home / Self Care | Payer: Federal, State, Local not specified - PPO | Attending: Obstetrics and Gynecology | Admitting: Obstetrics and Gynecology

## 2020-01-21 ENCOUNTER — Inpatient Hospital Stay (HOSPITAL_COMMUNITY): Payer: Federal, State, Local not specified - PPO | Admitting: Anesthesiology

## 2020-01-21 ENCOUNTER — Inpatient Hospital Stay (HOSPITAL_COMMUNITY): Payer: Federal, State, Local not specified - PPO

## 2020-01-21 DIAGNOSIS — D62 Acute posthemorrhagic anemia: Secondary | ICD-10-CM | POA: Diagnosis not present

## 2020-01-21 DIAGNOSIS — O9962 Diseases of the digestive system complicating childbirth: Secondary | ICD-10-CM | POA: Diagnosis present

## 2020-01-21 DIAGNOSIS — O24420 Gestational diabetes mellitus in childbirth, diet controlled: Secondary | ICD-10-CM | POA: Diagnosis present

## 2020-01-21 DIAGNOSIS — Z87891 Personal history of nicotine dependence: Secondary | ICD-10-CM | POA: Diagnosis not present

## 2020-01-21 DIAGNOSIS — Z3A39 39 weeks gestation of pregnancy: Secondary | ICD-10-CM | POA: Diagnosis not present

## 2020-01-21 DIAGNOSIS — O9081 Anemia of the puerperium: Secondary | ICD-10-CM | POA: Diagnosis not present

## 2020-01-21 DIAGNOSIS — K219 Gastro-esophageal reflux disease without esophagitis: Secondary | ICD-10-CM | POA: Diagnosis present

## 2020-01-21 DIAGNOSIS — O9902 Anemia complicating childbirth: Secondary | ICD-10-CM | POA: Diagnosis not present

## 2020-01-21 DIAGNOSIS — Z3490 Encounter for supervision of normal pregnancy, unspecified, unspecified trimester: Secondary | ICD-10-CM | POA: Diagnosis present

## 2020-01-21 DIAGNOSIS — Z880 Allergy status to penicillin: Secondary | ICD-10-CM | POA: Diagnosis not present

## 2020-01-21 DIAGNOSIS — O99824 Streptococcus B carrier state complicating childbirth: Secondary | ICD-10-CM | POA: Diagnosis present

## 2020-01-21 DIAGNOSIS — I4891 Unspecified atrial fibrillation: Secondary | ICD-10-CM | POA: Diagnosis present

## 2020-01-21 DIAGNOSIS — O9942 Diseases of the circulatory system complicating childbirth: Secondary | ICD-10-CM | POA: Diagnosis present

## 2020-01-21 DIAGNOSIS — O24919 Unspecified diabetes mellitus in pregnancy, unspecified trimester: Secondary | ICD-10-CM | POA: Diagnosis present

## 2020-01-21 DIAGNOSIS — O99892 Other specified diseases and conditions complicating childbirth: Secondary | ICD-10-CM | POA: Diagnosis not present

## 2020-01-21 DIAGNOSIS — O26893 Other specified pregnancy related conditions, third trimester: Secondary | ICD-10-CM | POA: Diagnosis present

## 2020-01-21 LAB — GLUCOSE, CAPILLARY
Glucose-Capillary: 116 mg/dL — ABNORMAL HIGH (ref 70–99)
Glucose-Capillary: 59 mg/dL — ABNORMAL LOW (ref 70–99)
Glucose-Capillary: 65 mg/dL — ABNORMAL LOW (ref 70–99)
Glucose-Capillary: 78 mg/dL (ref 70–99)
Glucose-Capillary: 86 mg/dL (ref 70–99)
Glucose-Capillary: 87 mg/dL (ref 70–99)
Glucose-Capillary: 96 mg/dL (ref 70–99)
Glucose-Capillary: 99 mg/dL (ref 70–99)

## 2020-01-21 LAB — RPR: RPR Ser Ql: NONREACTIVE

## 2020-01-21 LAB — CBC
HCT: 37.1 % (ref 36.0–46.0)
Hemoglobin: 12.5 g/dL (ref 12.0–15.0)
MCH: 29.3 pg (ref 26.0–34.0)
MCHC: 33.7 g/dL (ref 30.0–36.0)
MCV: 86.9 fL (ref 80.0–100.0)
Platelets: 201 10*3/uL (ref 150–400)
RBC: 4.27 MIL/uL (ref 3.87–5.11)
RDW: 13.3 % (ref 11.5–15.5)
WBC: 11.2 10*3/uL — ABNORMAL HIGH (ref 4.0–10.5)
nRBC: 0 % (ref 0.0–0.2)

## 2020-01-21 LAB — ABO/RH: ABO/RH(D): B POS

## 2020-01-21 LAB — TYPE AND SCREEN
ABO/RH(D): B POS
Antibody Screen: NEGATIVE

## 2020-01-21 MED ORDER — EPHEDRINE 5 MG/ML INJ: 10.0000 mg | INTRAVENOUS | Status: DC | PRN

## 2020-01-21 MED ORDER — PHENYLEPHRINE 40 MCG/ML (10ML) SYRINGE FOR IV PUSH (FOR BLOOD PRESSURE SUPPORT)
80.0000 ug | PREFILLED_SYRINGE | INTRAVENOUS | Status: DC | PRN
  Filled 2020-01-21: qty 10

## 2020-01-21 MED ORDER — VANCOMYCIN HCL IN DEXTROSE 1-5 GM/200ML-% IV SOLN
1000.0000 mg | Freq: Two times a day (BID) | INTRAVENOUS | Status: DC
  Administered 2020-01-21 – 2020-01-22 (×2): 1000 mg via INTRAVENOUS
  Filled 2020-01-21 (×2): qty 200

## 2020-01-21 MED ORDER — OXYTOCIN 40 UNITS IN NORMAL SALINE INFUSION - SIMPLE MED
1.0000 m[IU]/min | INTRAVENOUS | Status: DC
  Administered 2020-01-21: 2 m[IU]/min via INTRAVENOUS
  Filled 2020-01-21: qty 1000

## 2020-01-21 MED ORDER — SODIUM CHLORIDE (PF) 0.9 % IJ SOLN
INTRAMUSCULAR | Status: DC | PRN
  Administered 2020-01-21: 12 mL/h via EPIDURAL

## 2020-01-21 MED ORDER — TERBUTALINE SULFATE 1 MG/ML IJ SOLN: 0.2500 mg | Freq: Once | INTRAMUSCULAR | Status: DC | PRN

## 2020-01-21 MED ORDER — FENTANYL-BUPIVACAINE-NACL 0.5-0.125-0.9 MG/250ML-% EP SOLN
12.0000 mL/h | EPIDURAL | Status: DC | PRN
  Filled 2020-01-21: qty 250

## 2020-01-21 MED ORDER — LACTATED RINGERS IV SOLN: 500.0000 mL | Freq: Once | INTRAVENOUS | Status: DC

## 2020-01-21 MED ORDER — PROPRANOLOL HCL 20 MG PO TABS
20.0000 mg | ORAL_TABLET | Freq: Three times a day (TID) | ORAL | Status: DC
  Administered 2020-01-21 (×2): 20 mg via ORAL
  Filled 2020-01-21 (×5): qty 1

## 2020-01-21 MED ORDER — FENTANYL CITRATE (PF) 100 MCG/2ML IJ SOLN
INTRAMUSCULAR | Status: AC
  Administered 2020-01-21: 10:00:00 100 ug
  Filled 2020-01-21: qty 2

## 2020-01-21 MED ORDER — ONDANSETRON HCL 4 MG/2ML IJ SOLN
4.0000 mg | Freq: Four times a day (QID) | INTRAMUSCULAR | Status: DC | PRN
  Administered 2020-01-21: 22:00:00 4 mg via INTRAVENOUS
  Filled 2020-01-21: qty 2

## 2020-01-21 MED ORDER — SOD CITRATE-CITRIC ACID 500-334 MG/5ML PO SOLN
30.0000 mL | ORAL | Status: DC | PRN
  Administered 2020-01-22: 03:00:00 30 mL via ORAL
  Filled 2020-01-21: qty 30

## 2020-01-21 MED ORDER — FENTANYL CITRATE (PF) 100 MCG/2ML IJ SOLN
100.0000 ug | INTRAMUSCULAR | Status: DC | PRN
  Administered 2020-01-21: 16:00:00 100 ug via INTRAVENOUS
  Filled 2020-01-21 (×2): qty 2

## 2020-01-21 MED ORDER — PHENYLEPHRINE 40 MCG/ML (10ML) SYRINGE FOR IV PUSH (FOR BLOOD PRESSURE SUPPORT): 80.0000 ug | PREFILLED_SYRINGE | INTRAVENOUS | Status: DC | PRN

## 2020-01-21 MED ORDER — OXYTOCIN BOLUS FROM INFUSION: 500.0000 mL | Freq: Once | INTRAVENOUS | Status: DC

## 2020-01-21 MED ORDER — MISOPROSTOL 25 MCG QUARTER TABLET
25.0000 ug | ORAL_TABLET | ORAL | Status: DC | PRN
  Administered 2020-01-21 (×3): 25 ug via VAGINAL
  Filled 2020-01-21 (×3): qty 1

## 2020-01-21 MED ORDER — LACTATED RINGERS IV SOLN: INTRAVENOUS | Status: DC

## 2020-01-21 MED ORDER — LIDOCAINE HCL (PF) 1 % IJ SOLN: 30.0000 mL | INTRAMUSCULAR | Status: DC | PRN

## 2020-01-21 MED ORDER — LACTATED RINGERS IV SOLN: 500.0000 mL | INTRAVENOUS | Status: DC | PRN

## 2020-01-21 MED ORDER — DIPHENHYDRAMINE HCL 50 MG/ML IJ SOLN: 12.5000 mg | INTRAMUSCULAR | Status: DC | PRN

## 2020-01-21 MED ORDER — LIDOCAINE HCL (PF) 1 % IJ SOLN
INTRAMUSCULAR | Status: DC | PRN
  Administered 2020-01-21 (×2): 5 mL via EPIDURAL

## 2020-01-21 MED ORDER — OXYTOCIN 40 UNITS IN NORMAL SALINE INFUSION - SIMPLE MED: 2.5000 [IU]/h | INTRAVENOUS | Status: DC

## 2020-01-21 MED ORDER — ACETAMINOPHEN 325 MG PO TABS
650.0000 mg | ORAL_TABLET | ORAL | Status: DC | PRN
  Administered 2020-01-21 – 2020-01-22 (×2): 650 mg via ORAL
  Filled 2020-01-21 (×3): qty 2

## 2020-01-21 NOTE — Anesthesia Procedure Notes (Signed)
Epidural Patient location during procedure: OB Start time: 01/21/2020 5:28 PM End time: 01/21/2020 5:38 PM  Staffing Anesthesiologist: Achille Rich, MD Performed: anesthesiologist   Preanesthetic Checklist Completed: patient identified, IV checked, site marked, risks and benefits discussed, monitors and equipment checked, pre-op evaluation and timeout performed  Epidural Patient position: sitting Prep: DuraPrep Patient monitoring: heart rate, cardiac monitor, continuous pulse ox and blood pressure Approach: midline Location: L2-L3 Injection technique: LOR saline  Needle:  Needle type: Tuohy  Needle gauge: 17 G Needle length: 9 cm Needle insertion depth: 6 cm Catheter type: closed end flexible Catheter size: 19 Gauge Catheter at skin depth: 12 cm Test dose: negative and Other  Assessment Events: blood not aspirated, injection not painful, no injection resistance and negative IV test  Additional Notes Informed consent obtained prior to proceeding including risk of failure, 1% risk of PDPH, risk of minor discomfort and bruising.  Discussed rare but serious complications including epidural abscess, permanent nerve injury, epidural hematoma.  Discussed alternatives to epidural analgesia and patient desires to proceed.  Timeout performed pre-procedure verifying patient name, procedure, and platelet count.  Patient tolerated procedure well. Reason for block:procedure for pain

## 2020-01-21 NOTE — Progress Notes (Addendum)
Comfortable now with epidural  Temp:  [98.2 F (36.8 C)-98.7 F (37.1 C)] 98.4 F (36.9 C) (02/08 1916) Pulse Rate:  [73-101] 84 (02/08 1901) Resp:  [16-20] 18 (02/08 1901) BP: (74-137)/(43-84) 115/69 (02/08 1901) SpO2:  [100 %] 100 % (02/08 1756) Weight:  [120.1 kg] 120.1 kg (02/08 0005)  A&ox3 nml respirations Abd: soft, nt, gravid Cx: 6/70/-3, cephalic, iupc placed LE : no edema, nt bilat  FHT: 130, nml variability, +accels, occ quick variable, no decels Toco: irreg, q 3  A/P: iup at 39.4 1. IOL - contin pitocin, plan svd; plan birthball and position changes to help with fetal engagement into pelvis/descent 2. Fetal status reassuring 3. gbs - vanc (pcn allergy, clinda resist) 4. Telemetry - nml sinus rhythm

## 2020-01-21 NOTE — Progress Notes (Signed)
Pt recent CBG level 65. Offered patient some apple juice. Will reassess CBG in one hour.

## 2020-01-21 NOTE — H&P (Signed)
Jennifer Villanueva is a 29 y.o. G1P0 at [redacted]w[redacted]d gestation presents for IOL.   Antepartum course: h/o asd repair; h/o pulmonary hypertension but resolved after asd repair, a.fib episodes resolved immed after asd repair, pre-gestational DM - diet controlled, bmi >40; LGA-correction, AGA 71%  [facility] at [facility] since 11 wks.  See complete pre-natal records  History OB History    Gravida  1   Para      Term      Preterm      AB      Living  0     SAB      TAB      Ectopic      Multiple      Live Births             Past Medical History:  Diagnosis Date  . Anxiety   . ASD (atrial septal defect)   . Atrial fibrillation (HCC)   . Dysrhythmia    svt  . Gestational diabetes   . History of pulmonary hypertension   . Hypoglycemia   . Migraine   . PCOS (polycystic ovarian syndrome)   . Vulvar abscess    Past Surgical History:  Procedure Laterality Date  . APPENDECTOMY    . ASD REPAIR    . HYMENECTOMY     Family History: family history includes Colon cancer in her paternal aunt; Diabetes in her father, maternal grandmother, mother, paternal grandfather, and paternal grandmother; Heart attack in her maternal grandfather; Heart disease in her father; Mental retardation in her maternal uncle; Ovarian cancer in her maternal grandmother; Thyroid disease in her mother. Social History:  reports that she quit smoking about 3 years ago. Her smoking use included cigarettes. She has a 1.50 pack-year smoking history. She has never used smokeless tobacco. She reports previous alcohol use. She reports that she does not use drugs.  ROS: See above otherwise negative  Prenatal labs:  ABO, Rh: --/--/B POS, B POS Performed at [facility], [address], [place], [place] [address]  [id number]) Antibody: NEG (02/08 0049) Rubella: Immune (07/28 0000) RPR: NON REACTIVE (02/08 0049)  HBsAg: Negative (07/28 0000)  HIV:Non-reactive (07/28 0000)  GBS: Positive/-- (01/25 0000)   1 hr Glucola: abnormal early gtt /3 hr gtt c/w pre-gestational dm Genetic screening: Normal Anatomy US: Normal  Physical Exam:   Dilation: 1 Effacement (%): Thick Station: Ballotable Exam by:: lee Blood pressure 117/69, pulse 73, temperature 98.2 F (36.8 C), temperature source Oral, resp. rate 16, height 5\' 5"  (1.651 m), weight 120.1 kg. A&O x 3 HEENT: Normal Lungs: CTAB CV: RRR Abdominal: Soft, Non-tender, Gravid and Estimated fetal weight: 7 1/2 to 8lbs lbs  Lower Extremities: Non-edematous, Non-tender  Pelvic Exam:      Deferred; just checked by nursing and balloon in place, about 1cm dilate  Labs:  CBC:  Lab Results  Component Value Date   WBC 11.2 (H) 01/21/2020   RBC 4.27 01/21/2020   HGB 12.5 01/21/2020   HCT 37.1 01/21/2020   MCV 86.9 01/21/2020   MCH 29.3 01/21/2020   MCHC 33.7 01/21/2020   RDW 13.3 01/21/2020   PLT 201 01/21/2020   CMP: No results found for: NA, K, CL, CO2, GLUCOSE, BUN, CREATININE, CALCIUM, PROT, AST, ALT, ALBUMIN, ALKPHOS, BILITOT, GFRNONAA, GFRAA, ANIONGAP Urine: No results found for: COLORURINE, APPEARANCEUR, LABSPEC, PHURINE, GLUCOSEU, HGBUR, BILIRUBINUR, KETONESUR, PROTEINUR, NITRITE, LEUKOCYTESUR   Prenatal Transfer Tool  Maternal Diabetes: yes Genetic Screening: Normal Maternal Ultrasounds/Referrals: Normal Fetal  Ultrasounds or other Referrals:  Fetal echo Maternal Substance Abuse:  No Significant Maternal Medications:  Meds include: Protonix Other: baby asa, propanolol Significant Maternal Lab Results: Group B Strep positive  FHT: 130s, normal variability, +accels, no decels Toco; irreg, q 1-3 min  Assessment/Plan:  29 y.o. G1P0 at [redacted]w[redacted]d gestation   1. IOL - s/p cytotec, now pitocin, plan svd 2. Fetal status reassuring 3. H/o asd repair - episodes of a.fib after repair (about 2-3 years ago and none since), propanolol, did stop bbasa prior to IOL; h/o pulm htn that resolved and wnl echo throughout pregnancy and followed  by cardiology, delivery recommendation by cardiology was for telemetry; also followed by MFM: recommendation for IOL 39-40wga, early epidural 4. bmi >40 5. GERD 6. gbs pos, pcn allergy, clinda resistant, plan vanc for prophylaxis 7. Pre-gestational DM: diet controlled, fsbs q 4 hr and 2 hr when in active labor, wnl since admission   Jennifer Villanueva 01/21/2020, 3:43 PM

## 2020-01-21 NOTE — Progress Notes (Signed)
External monitors applied.  Telemetry monitors on charge.

## 2020-01-21 NOTE — Anesthesia Preprocedure Evaluation (Signed)
Anesthesia Evaluation  Patient identified by MRN, date of birth, ID band Patient awake    Reviewed: Allergy & Precautions, H&P , NPO status , Patient's Chart, lab work & pertinent test results  Airway Mallampati: II   Neck ROM: full    Dental   Pulmonary former smoker,    breath sounds clear to auscultation       Cardiovascular + dysrhythmias Atrial Fibrillation  Rhythm:regular Rate:Normal  Paroxysmal AF. S/p ASD repair. TTE (07/2019): minimal residual ASD shunting s/p repair. Normal LV function.   Neuro/Psych  Headaches, PSYCHIATRIC DISORDERS Anxiety    GI/Hepatic GERD  ,  Endo/Other  diabetes  Renal/GU      Musculoskeletal   Abdominal   Peds  Hematology   Anesthesia Other Findings   Reproductive/Obstetrics (+) Pregnancy                             Anesthesia Physical Anesthesia Plan  ASA: II  Anesthesia Plan: Epidural   Post-op Pain Management:    Induction: Intravenous  PONV Risk Score and Plan: 2 and Treatment may vary due to age or medical condition  Airway Management Planned: Natural Airway  Additional Equipment:   Intra-op Plan:   Post-operative Plan:   Informed Consent: I have reviewed the patients History and Physical, chart, labs and discussed the procedure including the risks, benefits and alternatives for the proposed anesthesia with the patient or authorized representative who has indicated his/her understanding and acceptance.       Plan Discussed with: Anesthesiologist and Surgeon  Anesthesia Plan Comments:         Anesthesia Quick Evaluation

## 2020-01-21 NOTE — Progress Notes (Signed)
S: Doing well, no complaints, pain increasing and asking for IV meds,  planning epidural  O: BP 105/63   Pulse 95   Temp 98.2 F (36.8 C) (Oral)   Resp 20   Ht 5\' 5"  (1.651 m)   Wt 120.1 kg   BMI 44.07 kg/m    FHT:  130s. + accels, no decels, 10 beat variability.  UC:   Not tracing well SVE:   Dilation: 1.5 Effacement (%): 50 Station: Ballotable Exam by:: lee SROM 830 am Cvx: FT/ long/  High/ ROM. cvx foley placement: consent obtained, vtx confirmed, foley balloon threaded through cvx and filled with 60cc NS. Pt tolerated well  A / P:  28 y.o.  OB History  Gravida Para Term Preterm AB Living  1 0 0 0 0 0  SAB TAB Ectopic Multiple Live Births  0 0 0 0 0   at [redacted]w[redacted]d IOL at term, cytotec, now on 3rd dose, will start pitocin once 3rd dose complete. Cervical foley placed. SROM - pulm htn, improved since ASD repait GBS pos, on Vancomycin  Fetal Wellbeing:  Category I Pain Control:  IV pain meds  Anticipated MOD:  NSVD  [redacted]w[redacted]d 01/21/2020, 9:49 AM

## 2020-01-22 ENCOUNTER — Encounter (HOSPITAL_COMMUNITY): Payer: Self-pay | Admitting: Obstetrics and Gynecology

## 2020-01-22 ENCOUNTER — Encounter (HOSPITAL_COMMUNITY)
Admission: RE | Disposition: A | Discharge status: Home / Self Care | Payer: Self-pay | Source: Home / Self Care | Attending: Obstetrics and Gynecology

## 2020-01-22 DIAGNOSIS — O99892 Other specified diseases and conditions complicating childbirth: Secondary | ICD-10-CM | POA: Diagnosis not present

## 2020-01-22 DIAGNOSIS — O24919 Unspecified diabetes mellitus in pregnancy, unspecified trimester: Secondary | ICD-10-CM | POA: Diagnosis present

## 2020-01-22 LAB — GLUCOSE, CAPILLARY: Glucose-Capillary: 119 mg/dL — ABNORMAL HIGH (ref 70–99)

## 2020-01-22 SURGERY — Surgical Case
Anesthesia: Epidural | Wound class: Clean Contaminated

## 2020-01-22 MED ORDER — COCONUT OIL OIL
1.0000 "application " | TOPICAL_OIL | Status: DC | PRN
  Administered 2020-01-23: 1 via TOPICAL

## 2020-01-22 MED ORDER — DIPHENHYDRAMINE HCL 25 MG PO CAPS: 25.0000 mg | ORAL_CAPSULE | ORAL | Status: DC | PRN

## 2020-01-22 MED ORDER — ONDANSETRON HCL 4 MG/2ML IJ SOLN: 4.0000 mg | Freq: Once | INTRAMUSCULAR | Status: DC | PRN

## 2020-01-22 MED ORDER — WITCH HAZEL-GLYCERIN EX PADS: 1.0000 "application " | MEDICATED_PAD | CUTANEOUS | Status: DC | PRN

## 2020-01-22 MED ORDER — SOD CITRATE-CITRIC ACID 500-334 MG/5ML PO SOLN: 30.0000 mL | ORAL | Status: DC

## 2020-01-22 MED ORDER — IBUPROFEN 800 MG PO TABS
800.0000 mg | ORAL_TABLET | Freq: Four times a day (QID) | ORAL | Status: DC
  Administered 2020-01-23 – 2020-01-24 (×4): 800 mg via ORAL
  Filled 2020-01-22 (×5): qty 1

## 2020-01-22 MED ORDER — MORPHINE SULFATE (PF) 0.5 MG/ML IJ SOLN
INTRAMUSCULAR | Status: DC | PRN
  Administered 2020-01-22: 3 mg via EPIDURAL

## 2020-01-22 MED ORDER — NALBUPHINE HCL 10 MG/ML IJ SOLN: 5.0000 mg | Freq: Once | INTRAMUSCULAR | Status: DC | PRN

## 2020-01-22 MED ORDER — SIMETHICONE 80 MG PO CHEW
80.0000 mg | CHEWABLE_TABLET | Freq: Three times a day (TID) | ORAL | Status: DC
  Administered 2020-01-22 – 2020-01-24 (×6): 80 mg via ORAL
  Filled 2020-01-22 (×7): qty 1

## 2020-01-22 MED ORDER — PROPRANOLOL HCL 20 MG PO TABS
20.0000 mg | ORAL_TABLET | Freq: Three times a day (TID) | ORAL | Status: DC
  Administered 2020-01-22 – 2020-01-24 (×7): 20 mg via ORAL
  Filled 2020-01-22 (×9): qty 1

## 2020-01-22 MED ORDER — SODIUM CHLORIDE 0.9% FLUSH: 3.0000 mL | INTRAVENOUS | Status: DC | PRN

## 2020-01-22 MED ORDER — PRENATAL MULTIVITAMIN CH
1.0000 | ORAL_TABLET | Freq: Every day | ORAL | Status: DC
  Administered 2020-01-22 – 2020-01-24 (×3): 1 via ORAL
  Filled 2020-01-22 (×3): qty 1

## 2020-01-22 MED ORDER — NALBUPHINE HCL 10 MG/ML IJ SOLN: 5.0000 mg | INTRAMUSCULAR | Status: DC | PRN

## 2020-01-22 MED ORDER — DEXAMETHASONE SODIUM PHOSPHATE 10 MG/ML IJ SOLN
INTRAMUSCULAR | Status: DC | PRN
  Administered 2020-01-22: 10 mg via INTRAVENOUS

## 2020-01-22 MED ORDER — OXYTOCIN 40 UNITS IN NORMAL SALINE INFUSION - SIMPLE MED
INTRAVENOUS | Status: DC | PRN
  Administered 2020-01-22: 40 [IU] via INTRAVENOUS

## 2020-01-22 MED ORDER — SIMETHICONE 80 MG PO CHEW: 80.0000 mg | CHEWABLE_TABLET | ORAL | Status: DC | PRN

## 2020-01-22 MED ORDER — MENTHOL 3 MG MT LOZG: 1.0000 | LOZENGE | OROMUCOSAL | Status: DC | PRN

## 2020-01-22 MED ORDER — OXYCODONE HCL 5 MG PO TABS: 5.0000 mg | ORAL_TABLET | Freq: Once | ORAL | Status: DC | PRN

## 2020-01-22 MED ORDER — SIMETHICONE 80 MG PO CHEW
80.0000 mg | CHEWABLE_TABLET | ORAL | Status: DC
  Administered 2020-01-22 – 2020-01-23 (×3): 80 mg via ORAL
  Filled 2020-01-22 (×2): qty 1

## 2020-01-22 MED ORDER — CLINDAMYCIN PHOSPHATE 900 MG/50ML IV SOLN: 900.0000 mg | INTRAVENOUS | Status: DC

## 2020-01-22 MED ORDER — LACTATED RINGERS IV SOLN: INTRAVENOUS | Status: DC

## 2020-01-22 MED ORDER — LACTATED RINGERS IV SOLN: INTRAVENOUS | Status: DC | PRN

## 2020-01-22 MED ORDER — OXYCODONE-ACETAMINOPHEN 5-325 MG PO TABS
1.0000 | ORAL_TABLET | ORAL | Status: DC | PRN
  Administered 2020-01-22: 18:00:00 1 via ORAL
  Filled 2020-01-22: qty 1

## 2020-01-22 MED ORDER — OXYTOCIN 40 UNITS IN NORMAL SALINE INFUSION - SIMPLE MED
INTRAVENOUS | Status: AC
  Filled 2020-01-22: qty 1000

## 2020-01-22 MED ORDER — SODIUM CHLORIDE 0.9 % IV SOLN: INTRAVENOUS | Status: DC | PRN

## 2020-01-22 MED ORDER — DIBUCAINE (PERIANAL) 1 % EX OINT: 1.0000 "application " | TOPICAL_OINTMENT | CUTANEOUS | Status: DC | PRN

## 2020-01-22 MED ORDER — DIPHENHYDRAMINE HCL 50 MG/ML IJ SOLN: 12.5000 mg | INTRAMUSCULAR | Status: DC | PRN

## 2020-01-22 MED ORDER — ONDANSETRON HCL 4 MG/2ML IJ SOLN
INTRAMUSCULAR | Status: DC | PRN
  Administered 2020-01-22: 4 mg via INTRAVENOUS

## 2020-01-22 MED ORDER — MORPHINE SULFATE (PF) 0.5 MG/ML IJ SOLN
INTRAMUSCULAR | Status: AC
  Filled 2020-01-22: qty 10

## 2020-01-22 MED ORDER — OXYCODONE HCL 5 MG/5ML PO SOLN: 5.0000 mg | Freq: Once | ORAL | Status: DC | PRN

## 2020-01-22 MED ORDER — FENTANYL CITRATE (PF) 100 MCG/2ML IJ SOLN
INTRAMUSCULAR | Status: AC
  Filled 2020-01-22: qty 2

## 2020-01-22 MED ORDER — SODIUM CHLORIDE 0.9 % IR SOLN
Status: DC | PRN
  Administered 2020-01-22: 1

## 2020-01-22 MED ORDER — SCOPOLAMINE 1 MG/3DAYS TD PT72: 1.0000 | MEDICATED_PATCH | Freq: Once | TRANSDERMAL | Status: DC

## 2020-01-22 MED ORDER — KETOROLAC TROMETHAMINE 30 MG/ML IJ SOLN
30.0000 mg | Freq: Once | INTRAMUSCULAR | Status: AC | PRN
  Administered 2020-01-22: 07:00:00 30 mg via INTRAVENOUS

## 2020-01-22 MED ORDER — ONDANSETRON HCL 4 MG/2ML IJ SOLN: 4.0000 mg | Freq: Three times a day (TID) | INTRAMUSCULAR | Status: DC | PRN

## 2020-01-22 MED ORDER — NALOXONE HCL 0.4 MG/ML IJ SOLN: 0.4000 mg | INTRAMUSCULAR | Status: DC | PRN

## 2020-01-22 MED ORDER — DIPHENHYDRAMINE HCL 25 MG PO CAPS
25.0000 mg | ORAL_CAPSULE | Freq: Four times a day (QID) | ORAL | Status: DC | PRN
  Administered 2020-01-22: 18:00:00 25 mg via ORAL
  Filled 2020-01-22: qty 1

## 2020-01-22 MED ORDER — ONDANSETRON HCL 4 MG/2ML IJ SOLN
INTRAMUSCULAR | Status: AC
  Filled 2020-01-22: qty 2

## 2020-01-22 MED ORDER — KETOROLAC TROMETHAMINE 30 MG/ML IJ SOLN
30.0000 mg | Freq: Four times a day (QID) | INTRAMUSCULAR | Status: AC
  Administered 2020-01-22 – 2020-01-23 (×4): 30 mg via INTRAVENOUS
  Filled 2020-01-22 (×4): qty 1

## 2020-01-22 MED ORDER — LIDOCAINE-EPINEPHRINE (PF) 2 %-1:200000 IJ SOLN
INTRAMUSCULAR | Status: DC | PRN
  Administered 2020-01-22 (×2): 5 mL via EPIDURAL

## 2020-01-22 MED ORDER — TETANUS-DIPHTH-ACELL PERTUSSIS 5-2.5-18.5 LF-MCG/0.5 IM SUSP: 0.5000 mL | Freq: Once | INTRAMUSCULAR | Status: DC

## 2020-01-22 MED ORDER — DEXAMETHASONE SODIUM PHOSPHATE 10 MG/ML IJ SOLN
INTRAMUSCULAR | Status: AC
  Filled 2020-01-22: qty 1

## 2020-01-22 MED ORDER — LACTATED RINGERS IV SOLN: 500.0000 mL | Freq: Once | INTRAVENOUS | Status: DC

## 2020-01-22 MED ORDER — SENNOSIDES-DOCUSATE SODIUM 8.6-50 MG PO TABS
2.0000 | ORAL_TABLET | ORAL | Status: DC
  Administered 2020-01-22 – 2020-01-23 (×2): 2 via ORAL
  Filled 2020-01-22 (×2): qty 2

## 2020-01-22 MED ORDER — FENTANYL CITRATE (PF) 100 MCG/2ML IJ SOLN
INTRAMUSCULAR | Status: DC | PRN
  Administered 2020-01-22: 100 ug via EPIDURAL

## 2020-01-22 MED ORDER — OXYTOCIN 40 UNITS IN NORMAL SALINE INFUSION - SIMPLE MED
2.5000 [IU]/h | INTRAVENOUS | Status: DC
  Administered 2020-01-22: 08:00:00 2.5 [IU]/h via INTRAVENOUS

## 2020-01-22 MED ORDER — HYDROMORPHONE HCL 1 MG/ML IJ SOLN: 0.2500 mg | INTRAMUSCULAR | Status: DC | PRN

## 2020-01-22 MED ORDER — NALOXONE HCL 4 MG/10ML IJ SOLN
1.0000 ug/kg/h | INTRAVENOUS | Status: DC | PRN
  Filled 2020-01-22: qty 5

## 2020-01-22 MED ORDER — KETOROLAC TROMETHAMINE 30 MG/ML IJ SOLN
INTRAMUSCULAR | Status: AC
  Filled 2020-01-22: qty 1

## 2020-01-22 SURGICAL SUPPLY — 41 items
BENZOIN TINCTURE PRP APPL 2/3 (GAUZE/BANDAGES/DRESSINGS) ×3 IMPLANT
CANISTER PREVENA PLUS 150 (CANNISTER) ×3 IMPLANT
CHLORAPREP W/TINT 26ML (MISCELLANEOUS) ×3 IMPLANT
CLAMP CORD UMBIL (MISCELLANEOUS) IMPLANT
CLOSURE WOUND 1/2 X4 (GAUZE/BANDAGES/DRESSINGS)
CLOTH BEACON ORANGE TIMEOUT ST (SAFETY) ×3 IMPLANT
DRESSING PREVENA PLUS CUSTOM (GAUZE/BANDAGES/DRESSINGS) ×1 IMPLANT
DRSG OPSITE POSTOP 4X10 (GAUZE/BANDAGES/DRESSINGS) ×3 IMPLANT
DRSG PREVENA PLUS CUSTOM (GAUZE/BANDAGES/DRESSINGS) ×3
ELECT REM PT RETURN 9FT ADLT (ELECTROSURGICAL) ×3
ELECTRODE REM PT RTRN 9FT ADLT (ELECTROSURGICAL) ×1 IMPLANT
EXTRACTOR VACUUM KIWI (MISCELLANEOUS) ×3 IMPLANT
GLOVE BIOGEL PI IND STRL 6.5 (GLOVE) ×1 IMPLANT
GLOVE BIOGEL PI IND STRL 7.0 (GLOVE) ×2 IMPLANT
GLOVE BIOGEL PI INDICATOR 6.5 (GLOVE) ×2
GLOVE BIOGEL PI INDICATOR 7.0 (GLOVE) ×4
GLOVE ECLIPSE 6.5 STRL STRAW (GLOVE) ×3 IMPLANT
GOWN STRL REUS W/TWL LRG LVL3 (GOWN DISPOSABLE) ×6 IMPLANT
HEMOSTAT ARISTA ABSORB 3G PWDR (HEMOSTASIS) ×3 IMPLANT
HOVERMATT SINGLE USE (MISCELLANEOUS) ×3 IMPLANT
KIT ABG SYR 3ML LUER SLIP (SYRINGE) IMPLANT
NEEDLE HYPO 25X5/8 SAFETYGLIDE (NEEDLE) IMPLANT
NS IRRIG 1000ML POUR BTL (IV SOLUTION) ×3 IMPLANT
PACK C SECTION WH (CUSTOM PROCEDURE TRAY) ×3 IMPLANT
PAD OB MATERNITY 4.3X12.25 (PERSONAL CARE ITEMS) ×3 IMPLANT
PENCIL SMOKE EVAC W/HOLSTER (ELECTROSURGICAL) ×3 IMPLANT
RETRACTOR TRAXI PANNICULUS (MISCELLANEOUS) ×1 IMPLANT
RETRACTOR WND ALEXIS 25 LRG (MISCELLANEOUS) IMPLANT
RTRCTR WOUND ALEXIS 25CM LRG (MISCELLANEOUS)
STRIP CLOSURE SKIN 1/2X4 (GAUZE/BANDAGES/DRESSINGS) IMPLANT
SUT MNCRL 0 VIOLET CTX 36 (SUTURE) ×2 IMPLANT
SUT MONOCRYL 0 CTX 36 (SUTURE) ×4
SUT PLAIN 2 0 XLH (SUTURE) IMPLANT
SUT VIC AB 0 CT1 36 (SUTURE) ×6 IMPLANT
SUT VIC AB 3-0 CT1 27 (SUTURE)
SUT VIC AB 3-0 CT1 TAPERPNT 27 (SUTURE) IMPLANT
SUT VIC AB 4-0 PS2 27 (SUTURE) ×3 IMPLANT
TOWEL OR 17X24 6PK STRL BLUE (TOWEL DISPOSABLE) ×3 IMPLANT
TRAXI PANNICULUS RETRACTOR (MISCELLANEOUS) ×2
TRAY FOLEY W/BAG SLVR 14FR LF (SET/KITS/TRAYS/PACK) IMPLANT
WATER STERILE IRR 1000ML POUR (IV SOLUTION) ×3 IMPLANT

## 2020-01-22 NOTE — Lactation Note (Signed)
This note was copied from a baby's chart. Lactation Consultation Note  Patient Name: Jennifer Villanueva [id number] Date: 01/22/2020 Reason for consult: Initial assessment;Primapara;1st time breastfeeding;Term GDM-diet controlled, history of cardiac surgery  LC in to visit with P1 Mom of term baby at 4 hrs old.  Mom had a C/S for FTP.  Mom GDM, diet controlled, first CBG 54.  Mom has history of ASD and atrial fib.  Mom on telemetry post-operatively.  Baby latched in PACU and had a 45 min feeding.   Baby swaddled in crib and Mom resting in bed.  Talked about benefits of STS during the first days of life.  Offered to place baby back STS and Mom quick to agree.   Reviewed breast massage and hand expression on left breast.  Colostrum easily expressed.  Mom states baby latched to left breast.   Sat Mom up and placed pillow vertically behind her and stacked pillows to position baby in football hold on right breast.  Mom has large, erect nipples and compressible areola.  Assisted Mom in holding baby's head and baby opened widely and latched.  Baby noted to have deep jaw extensions and swallows identified.  FOB showed how to assess lower lip for flanging.  When baby fell asleep, nipple noted to be rounded and not pinched.  Mom denies feeling any discomfort with latch.   Encouraged STS as much as possible, offering breast with cues.  Goal of >8 feedings per 24 hrs shared.   Mom aware of IP and OP Lactation support available to her.  Mom knows to call prn.  Maternal Data Formula Feeding for Exclusion: No Has patient been taught Hand Expression?: Yes Does the patient have breastfeeding experience prior to this delivery?: No  Feeding Feeding Type: Breast Fed  LATCH Score Latch: Grasps breast easily, tongue down, lips flanged, rhythmical sucking.  Audible Swallowing: Spontaneous and intermittent  Type of Nipple: Everted at rest and after stimulation  Comfort (Breast/Nipple): Soft /  non-tender  Hold (Positioning): Assistance needed to correctly position infant at breast and maintain latch.  LATCH Score: 9  Interventions Interventions: Breast feeding basics reviewed;Assisted with latch;Skin to skin;Breast massage;Hand express;Breast compression;Adjust position;Support pillows;Position options  Lactation Tools Discussed/Used WIC Program: No   Consult Status Consult Status: Follow-up Date: 01/23/20 Follow-up type: In-patient    [name] 01/22/2020, 8:38 AM

## 2020-01-22 NOTE — Op Note (Signed)
Cesarean Section Procedure Note   Jennifer Villanueva  01/21/2020 - 01/22/2020  Indications: arrest of dilation   Pre-operative Diagnosis: Arrest of Dilation.  Procedure: 1ltcs Post-operative Diagnosis: Same   Surgeon: Surgeon(s) and Role:    * Leslieanne Cobarrubias, Candace Gallus, MD - Primary   Assistants: Carlean Jews, CNM  Anesthesia: epidural   Procedure Details:  The patient was seen in the labor Room. The risks, benefits, complications, treatment options, and expected outcomes were discussed with the patient. The patient concurred with the proposed plan, giving informed consent. identified as Jennifer Villanueva and the procedure verified as C-Section Delivery. A Time Out was held and the above information confirmed.  After induction of anesthesia, the patient was draped and prepped in the usual sterile manner, foley was draining urine well.  A pfannenstiel incision was made and carried down through the subcutaneous tissue to the fascia. Fascial incision was made and extended transversely. The fascia was separated from the underlying rectus tissue superiorly and inferiorly. The peritoneum was identified and entered. Peritoneal incision was extended longitudinally. Alexis-O retractor placed. The utero-vesical peritoneal reflection was incised transversely and the bladder flap was bluntly freed from the lower uterine segment. A low transverse uterine incision was made. Delivered from cephalic presentation was a viable female  infant with vigorous cry. Apgar scores of 8 at one minute and 9 at five minutes. Delayed cord clamping done at 1 minute and baby handed to NICU team in attendance. Cord ph was not sent. Cord blood was obtained for evaluation. The placenta was removed Intact manually and appeared normal. The uterine outline, tubes and ovaries appeared normal}. The uterine incision was closed with running locked sutures of . A second imbricating layer sutured.   Hemostasis was observed with figure of eight  stitchs of vicryl and a small running locked stitch near incision to achieve excellent hemostasis.  Arista was then placed on the incision. Alexis retractor removed.  The fascia was then reapproximated with running sutures of 0Vicryl. The subcuticular closure was performed using 2-0plain gut. The skin was closed with 4-0Vicryl.   Instrument, sponge, and needle counts were correct prior the abdominal closure and were correct at the conclusion of the case.    Findings: viable female infant, apgars 8/9; normal uterus, tubes/ovaries bilat   Estimated Blood Loss: 634   Total IV Fluids: LR  Urine Output: 100CC OF clear urine  Specimens: none  Complications: no complications  Disposition: PACU - hemodynamically stable.   Maternal Condition: stable   Baby condition / location:  Couplet care / Skin to Skin  Attending Attestation: I performed the procedure.   Signed: Surgeon(s): Amado Nash Candace Gallus, MD

## 2020-01-22 NOTE — Progress Notes (Signed)
MOB was referred for history of depression/anxiety. * Referral screened out by Clinical Social Worker because none of the following criteria appear to apply: ~ History of anxiety/depression during this pregnancy, or of post-partum depression following prior delivery. ~ Diagnosis of anxiety and/or depression within last 3 years. No concerns noted in her OB records. OR * MOB's symptoms currently being treated with medication and/or therapy.  Please contact the Clinical Social Worker if needs arise, by MOB request, or if MOB scores greater than 9/yes to question 10 on Edinburgh Postpartum Depression Screen.  Kathlen Sakurai Boyd-Gilyard, MSW, LCSW Clinical Social Work [phone] 

## 2020-01-22 NOTE — Transfer of Care (Signed)
Immediate Anesthesia Transfer of Care Note  Patient: Jennifer Villanueva  Procedure(s) Performed: CESAREAN SECTION (N/A )  Patient Location: PACU  Anesthesia Type:Epidural  Level of Consciousness: awake and alert   Airway & Oxygen Therapy: Patient Spontanous Breathing  Post-op Assessment: Report given to RN  Post vital signs: Reviewed and stable  Last Vitals:  Vitals Value Taken Time  BP 111/71 01/22/20 0601  Temp    Pulse 87 01/22/20 0604  Resp 14 01/22/20 0604  SpO2 93 % 01/22/20 0604  Vitals shown include unvalidated device data.  Last Pain:  Vitals:   01/22/20 0318  TempSrc: Oral  PainSc:          Complications: No apparent anesthesia complications

## 2020-01-22 NOTE — Progress Notes (Addendum)
Comfortable  Temp:  [98.2 F (36.8 C)-98.7 F (37.1 C)] 98.4 F (36.9 C) (02/09 0053) Pulse Rate:  [73-266] 266 (02/09 0230) Resp:  [16-20] 20 (02/09 0230) BP: (74-137)/(38-101) 134/71 (02/09 0230) SpO2:  [100 %] 100 % (02/08 1756)  A&ox3 Abd: soft, nt, graivd Cx: 7-8, thick posteriorly, thin anteriorly, -2 with caput Le: no edema, nt bilat  FHT: 130s, nml variability, +accels, occ variable TOCO: q 1-3 min  A/P: iup at 39.5 wga 1. Arrest of dilation - patient was making cervical change then now essentially unchanged for about 4 hours; recommend c/s; pt agrees; risk of bleeding (possible need for transfusion), infection, injury to bowel, bladder, nerves, blood vessels, risk of blood clot, risk of anesthesia, risk of further surgery 2. Fetal status reassuring 3. Telemetry: nml sinus rhythm

## 2020-01-22 NOTE — Anesthesia Postprocedure Evaluation (Signed)
Anesthesia Post Note  Patient: Jennifer Villanueva  Procedure(s) Performed: CESAREAN SECTION (N/A )     Patient location during evaluation: Mother Baby Anesthesia Type: Epidural Level of consciousness: oriented and awake and alert Pain management: pain level controlled Vital Signs Assessment: post-procedure vital signs reviewed and stable Respiratory status: spontaneous breathing and respiratory function stable Cardiovascular status: blood pressure returned to baseline and stable Postop Assessment: no headache, no backache, no apparent nausea or vomiting and able to ambulate Anesthetic complications: no    Last Vitals:  Vitals:   01/22/20 1101 01/22/20 2028  BP: 112/70 101/60  Pulse: 96 84  Resp: 18 18  Temp:  36.7 C  SpO2:  98%    Last Pain:  Vitals:   01/22/20 2030  TempSrc:   PainSc: 0-No pain   Pain Goal:                Epidural/Spinal Function Cutaneous sensation: Normal sensation (01/22/20 2030), Patient able to flex knees: Yes (01/22/20 2030), Patient able to lift hips off bed: Yes (01/22/20 2030), Back pain beyond tenderness at insertion site: No (01/22/20 2030), Progressively worsening motor and/or sensory loss: No (01/22/20 2030), Bowel and/or bladder incontinence post epidural: No (01/22/20 2030)  Trevor Iha

## 2020-01-23 LAB — CBC
HCT: 27.8 % — ABNORMAL LOW (ref 36.0–46.0)
Hemoglobin: 9.1 g/dL — ABNORMAL LOW (ref 12.0–15.0)
MCH: 29.2 pg (ref 26.0–34.0)
MCHC: 32.7 g/dL (ref 30.0–36.0)
MCV: 89.1 fL (ref 80.0–100.0)
Platelets: 148 10*3/uL — ABNORMAL LOW (ref 150–400)
RBC: 3.12 MIL/uL — ABNORMAL LOW (ref 3.87–5.11)
RDW: 13.7 % (ref 11.5–15.5)
WBC: 12.7 10*3/uL — ABNORMAL HIGH (ref 4.0–10.5)
nRBC: 0 % (ref 0.0–0.2)

## 2020-01-23 LAB — GLUCOSE, CAPILLARY: Glucose-Capillary: 87 mg/dL (ref 70–99)

## 2020-01-23 LAB — BIRTH TISSUE RECOVERY COLLECTION (PLACENTA DONATION)

## 2020-01-23 MED ORDER — MAGNESIUM OXIDE 400 (241.3 MG) MG PO TABS
400.0000 mg | ORAL_TABLET | Freq: Every day | ORAL | Status: DC
  Administered 2020-01-23 – 2020-01-24 (×2): 400 mg via ORAL
  Filled 2020-01-23 (×2): qty 1

## 2020-01-23 MED ORDER — OXYCODONE HCL 5 MG PO TABS
5.0000 mg | ORAL_TABLET | ORAL | Status: DC | PRN
  Administered 2020-01-23 – 2020-01-24 (×2): 5 mg via ORAL
  Filled 2020-01-23 (×2): qty 1

## 2020-01-23 MED ORDER — ACETAMINOPHEN 500 MG PO TABS: 1000.0000 mg | ORAL_TABLET | Freq: Four times a day (QID) | ORAL | Status: DC | PRN

## 2020-01-23 MED ORDER — POLYSACCHARIDE IRON COMPLEX 150 MG PO CAPS
150.0000 mg | ORAL_CAPSULE | Freq: Every day | ORAL | Status: DC
  Administered 2020-01-23 – 2020-01-24 (×2): 150 mg via ORAL
  Filled 2020-01-23 (×2): qty 1

## 2020-01-23 NOTE — Lactation Note (Addendum)
This note was copied from a baby's chart. Lactation Consultation Note Baby 21 hrs old. LC feels baby is wanting to cluster feed. Baby had a long period where he didn't feed, now wants to stay on the breast. Newborn behavior and feeding habits discussed. Mom stated after sleeping for so long baby stayed on the breast 65 minutes. Mom has large pendulous breast w/long everted nipples.  LC was called d/t baby gagging on nipples. Mom stated she put him on the breast d/t cueing after long feeding and baby gagged, mom put baby on opposite breast and baby gagged. Baby has been feeding fine on breast w/o gagging. Explained baby may have pulled nipple to far into mouth or baby may had needed to spit up after feeding.  LC tried to hand express collected 1 dot from the breast that the baby BF 65 minutes on. LC hand expressed w/colostrum noted to breast that latched baby on. Mom stated she felt baby is hungry and she doesn't have much milk. She knows they have small stomach but feels baby needs something. When LC entered rm. Baby was screaming. LC took baby mom holding him STS on her chest. LC burped baby and baby calmed and quiet. Placed baby in football position to breast. Baby BF well w/no gagging. occasional swallows heard. Mom shown how to use DEBP & how to disassemble, clean, & reassemble parts. Mom knows to pump q3h for 15-20 min.  LC discussed Donor milk using curve tip syring. Mom in agreement. Signed consent. Supplemental guidelines according to hours of age given. LC warmed 10 ml. Baby only took 5 ml. Discussed milk storage for donor milk and mom's breast milk. Gave in curve tip syring. Baby humping tongue in back, occasionally suckling. Offered spoon feeding so baby could lap milk. Baby not very interested. Baby may have tight frenulum d/t heart shaped tongue. Has recessed chin. Encouraged to do chin tug to widen flange. Mom was pumping when LC left.  Patient Name: Jennifer Villanueva [id number]  Date: 01/23/2020 Reason for consult: Initial assessment;Term;Maternal endocrine disorder;Primapara Type of Endocrine Disorder?: PCOS   Maternal Data    Feeding Feeding Type: Donor Breast Milk  LATCH Score Latch: Grasps breast easily, tongue down, lips flanged, rhythmical sucking.  Audible Swallowing: A few with stimulation  Type of Nipple: Everted at rest and after stimulation  Comfort (Breast/Nipple): Soft / non-tender  Hold (Positioning): Assistance needed to correctly position infant at breast and maintain latch.  LATCH Score: 8  Interventions Interventions: Breast feeding basics reviewed;Support pillows;Assisted with latch;Position options;Skin to skin;Breast massage;Hand express;Breast compression;Adjust position;DEBP  Lactation Tools Discussed/Used Tools: Pump;Flanges Flange Size: 27 Breast pump type: Double-Electric Breast Pump Pump Review: Setup, frequency, and cleaning;Milk Storage Initiated by:: Peri Jefferson RN IBCLC Date initiated:: 01/23/20   Consult Status Consult Status: Follow-up Date: 01/23/20(in pm) Follow-up type: In-patient    Charyl Dancer 01/23/2020, 1:58 AM

## 2020-01-23 NOTE — Lactation Note (Addendum)
This note was copied from a baby's chart. Lactation Consultation Note  Patient Name: Jennifer Villanueva [id number] Date: 01/23/2020 Reason for consult: Follow-up assessment;Term Baby is 30 hours old/4% weight loss.  Baby has not fed in 7 hours.  Mom states she is getting ready to feed baby donor milk and then pump breasts.  Instructed to call for feeding assist prn.  Maternal Data    Feeding    LATCH Score                   Interventions    Lactation Tools Discussed/Used     Consult Status Consult Status: Follow-up Date: 01/24/20 Follow-up type: In-patient    Huston Foley 01/23/2020, 11:01 AM

## 2020-01-23 NOTE — Lactation Note (Signed)
This note was copied from a baby's chart. Lactation Consultation Note  Patient Name: Jennifer Villanueva Date: 01/23/2020 Reason for consult: Follow-up assessment;Primapara;1st time breastfeeding;Other (Comment);Mother's request(baby post circ and to sleepy to latch - LC enc mom to call with feeding cues and when awake) Type of Endocrine Disorder?: PCOS  Baby is 34 hours old , LC was called for latch assist, baby recently returned from circ and is sleepy.  LC placed baby STS and attempted to latch after hand expressing drops and baby did not latch. LC mentioned to mom and dad the baby probably sleep off the Tylenol.  LC assisted to place the baby STS and encouraged mom to call for assistance.  Jerrell Mylar mentioned she could help mom if she called.     Maternal Data    Feeding Feeding Type: Breast Fed Nipple Type: Slow - flow  LATCH Score Latch: Too sleepy or reluctant, no latch achieved, no sucking elicited.  Audible Swallowing: None  Type of Nipple: Everted at rest and after stimulation  Comfort (Breast/Nipple): Soft / non-tender  Hold (Positioning): Assistance needed to correctly position infant at breast and maintain latch.  LATCH Score: 5  Interventions Interventions: Breast feeding basics reviewed;Assisted with latch;Skin to skin;Reverse pressure  Lactation Tools Discussed/Used     Consult Status Consult Status: Follow-up Date: 01/24/20 Follow-up type: In-patient    Matilde Sprang Ree Alcalde 01/23/2020, 2:31 PM

## 2020-01-23 NOTE — Progress Notes (Signed)
POSTOPERATIVE DAY # 1 S/P Primary LTCS for arrest of dilation, baby boy "Jennifer Villanueva"  S:         Reports feeling sore, c/o incisional pain with movement - has only taken 1 Percocet tablet             Tolerating po intake / no nausea / no vomiting / + flatus / no BM  Denies dizziness, SOB, or CP             Bleeding is light             Up ad lib / ambulatory/ voiding QS without difficulty  Newborn breast feeding with formula supplementation - baby latching well, but reports low colostrum supply; pumping after feeds / Circumcision - completed today   O:  VS: BP (!) 101/56 (BP Location: Left Arm)   Pulse 86   Temp 97.7 F (36.5 C) (Oral)   Resp 18   Ht 5\' 5"  (1.651 m)   Wt 120.1 kg   SpO2 98%   Breastfeeding Unknown   BMI 44.07 kg/m    LABS:               Recent Labs    01/21/20 0049 01/23/20 0640  WBC 11.2* 12.7*  HGB 12.5 9.1*  PLT 201 148*               Bloodtype: --/--/B POS, B POS Performed at [facility], [address], [place], [place]  [phone])  Rubella: Immune (07/28 0000)                                             I&O: Intake/Output      02/09 0701 - 02/10 0700 02/10 0701 - 02/11 0700   P.O. 1560    I.V. (mL/kg) 406.3 (3.4)    Total Intake(mL/kg) 1966.3 (16.4)    Urine (mL/kg/hr) 1300 (0.5)    Blood     Total Output 1300    Net +666.3                      Physical Exam:             Alert and Oriented X3  Lungs: Clear and unlabored  Heart: regular rate and rhythm / no murmurs  Abdomen: soft, non-tender, obese, mild gaseous distention, active bowel sounds              Fundus: firm, non-tender, below umbilicus             Dressing: Prevena wound dressing              Incision:  approximated with sutures / no erythema / no ecchymosis / no drainage  Perineum: intact  Lochia: small rubra on pad  Extremities: trace LE edema, no calf pain or tenderness,   A/P:     POD # 1 S/P Primary LTCS            A1GDM   - f/u 6-8 weeks PP  Hx. Of  ASD repair in 2018, and hx pulmonary hypertension and paroxysmal A-fib, which resolved after repair   - on Propranolol 20mg  TID   - NSR with 1 PAC, okay to d/c continuous telemetry   Postoperative ABL Anemia   - Begin Niferex 150mg  PO daily and Magnesium oxide 400mg  PO daily when  bowel function improves  Warm liquids and ambulation to promote bowel motility   See lactation today  Routine postoperative care   D/C IV now  Change pain medication regimen to allow more Tylenol coverage   - D/C Percocet   - Tylenol 1000mg  every 6 hrs PRN and Oxycodone 5-10mg  every 4hrs PRN for severe pain   Continue current care               Consult for plan: Dr. Christiana Pellant, MSN, CNM [facility]

## 2020-01-24 DIAGNOSIS — O9902 Anemia complicating childbirth: Secondary | ICD-10-CM | POA: Diagnosis not present

## 2020-01-24 MED ORDER — SENNOSIDES-DOCUSATE SODIUM 8.6-50 MG PO TABS: 2.0000 | ORAL_TABLET | ORAL | Status: AC

## 2020-01-24 MED ORDER — ACETAMINOPHEN 500 MG PO TABS: 1000.0000 mg | ORAL_TABLET | Freq: Four times a day (QID) | ORAL | 0 refills | Status: AC | PRN

## 2020-01-24 MED ORDER — COCONUT OIL OIL: 1.0000 "application " | TOPICAL_OIL | 0 refills | Status: AC | PRN

## 2020-01-24 MED ORDER — OXYCODONE HCL 5 MG PO TABS: 5.0000 mg | ORAL_TABLET | ORAL | 0 refills | Status: AC | PRN

## 2020-01-24 MED ORDER — SIMETHICONE 80 MG PO CHEW: 80.0000 mg | CHEWABLE_TABLET | Freq: Three times a day (TID) | ORAL | 0 refills | Status: AC

## 2020-01-24 MED ORDER — POLYSACCHARIDE IRON COMPLEX 150 MG PO CAPS: 150.0000 mg | ORAL_CAPSULE | Freq: Every day | ORAL | Status: AC

## 2020-01-24 MED ORDER — IBUPROFEN 800 MG PO TABS: 800.0000 mg | ORAL_TABLET | Freq: Four times a day (QID) | ORAL | 0 refills | Status: AC

## 2020-01-24 MED ORDER — MAGNESIUM OXIDE 400 (241.3 MG) MG PO TABS: 400.0000 mg | ORAL_TABLET | Freq: Every day | ORAL | Status: AC

## 2020-01-24 NOTE — Progress Notes (Signed)
Patient ID: Jennifer Villanueva, female   DOB: Apr 08, 1991, 29 y.o.   MRN: [id number] Subjective: POD# 2 Live born female  Birth Weight: 7 lb 14.1 oz (3575 g) APGAR: 8, 9  Newborn Delivery   Birth date/time: 01/22/2020 04:18:00 Delivery type: C-Section, Low Transverse Trial of labor: Yes C-section categorization: Primary     Baby name: Jennifer Villanueva Delivering provider: Rhoderick Moody Villanueva   circumcision completed Feeding: breast  Pain control at delivery: Epidural   Reports feeling well, desires DC home today  Patient reports tolerating PO.   Breast symptoms: none Pain controlled with PO meds Denies HA/SOB/C/P/N/V/dizziness. Flatus present, + BM. She reports vaginal bleeding as normal, without clots.  She is ambulating, urinating without difficulty.     Objective:   VS:    Vitals:   01/23/20 2151 01/23/20 2338 01/24/20 0508 01/24/20 0830  BP: 114/71 121/67 125/71 (!) 111/58  Pulse: 93 86 92 81  Resp: 16 17 18 18   Temp: 98.3 F (36.8 C) 97.7 F (36.5 C) 98.1 F (36.7 C) 98 F (36.7 C)  TempSrc: Oral Oral Oral Oral  SpO2: 96% 98% 98% 98%  Weight:      Height:        No intake or output data in the 24 hours ending 01/24/20 0901      Recent Labs    01/23/20 0640  WBC 12.7*  HGB 9.1*  HCT 27.8*  PLT 148*     Blood type: --/--/B POS, B POS Performed at [facility], [address], [place], [place]  [phone] (70/01)  Rubella: Immune (07/28 0000)  Vaccines: TDaP UTD         Flu    UTD   Physical Exam:  General: alert, cooperative and no distress CV: Regular rate and rhythm Resp: clear Abdomen: soft, nontender, normal bowel sounds Incision: clean, dry, intact and Prevena dressing patent Uterine Fundus: firm, below umbilicus, nontender Lochia: minimal Ext: edema trace pedal      Assessment/Plan: 29 y.o.   POD# 2. G1P1001                  Principal Problem:   Maternal anemia, with delivery  - Oral fe and Mag Ox x 4-6 wks PP Active Problems:   Atrial  fibrillation with RVR (HCC)  - stable on propranolol   - f/u cardiologist as needed   Encounter for planned induction of labor   Postpartum care following cesarean delivery 2/9   Delivery by emergency cesarean - indication: arrest of dilation   DM (diabetes mellitus) in pregnancy  - stable CBG's PP. F/U at 6 wks PP visit  Doing well, stable.    Routine post-op care             DC home today w/ instructions  F/U at [facility] OB[facility] office in 1 week, may do virtual visit / liives remote. BP check at home and dressing removal by patient at home 1 week PP  [facility], CNM, MSN 01/24/2020, 9:01 AM

## 2020-01-24 NOTE — Discharge Instructions (Signed)

## 2020-01-24 NOTE — Lactation Note (Signed)
This note was copied from a baby's chart. Lactation Consultation Note  Patient Name: Jennifer Villanueva OFBPZ'W Date: 01/24/2020 Reason for consult: Follow-up assessment;Primapara;Term;Infant weight loss  [id number]  F/U visit with P1 mom who delivered @ 39.5wks, baby is now 20 hours old with documented 7% wt loss.  LC entered room to find mom in bed with significant other at bedside. Baby swaddled and stirring in bassinet at bedside. Parents state baby just finished feeding for 30 minutes. Mom states baby was feeding for longer periods but baby "was using me as a pacifier" so mom ended feeding once baby slowed down and slept intermittently at breast. Mom also c/o soreness on left breast from extended feedings. Mom states baby cluster fed most of the night after sleeping most of the afternoon post-circumcision. Mom also states baby gained weight today, up to 7lbs 6 oz.   Baby cuing in the bassinet and offered to assist with latch but breakfast trays arrived during consult and parents state they just fed baby for 30 minutes and feel like it was a good feeding. Parents state they still have some donor milk in the fridge that expires @ 0930. Parents state they haven't had to use donor milk since last night because baby was satisfied after breastfeeding. Encouraged parents to supplement with donor milk from current feeding as baby still appears hungry and milk will expire before next feeding.  Also encouraged mom to change positions/holds each feeding to help relieve soreness. Mom states she has been solely feeding in football hold and is confident in her techniques. Demonstrated cross-cradle hold and explained purpose in alternating positions to alternate shape of the baby's mouth on her nipple/breast.  Mom states she hasn't pumped recently as baby has been feeding adequately. Reviewed regular breast stimulation and benefits of pumping after feedings. Mom states she has a [place] DEBP at home. Mom does  not participate in [facility] program.  Also encouraged hand expression prior to latching and mom states she hasn't needed to do that since feeding is going better in last 12 hours. Again reinforced as much breast stimulation as possible to induce lactogenesis.  Reviewed IP lactation services and encouraged to call out for any concerns or assistance with latching.  F/U conversation with RN to verify wt loss/gain and also to encourage parents to use donor milk prior to expiration as supplement after breastfeeding session.  Interventions Interventions: Skin to skin;Position options;DEBP  Lactation Tools Discussed/Used WIC Program: No   Consult Status Consult Status: Follow-up Date: 01/25/20 Follow-up type: In-patient    [name] 01/24/2020, 9:20 AM

## 2020-01-24 NOTE — Discharge Summary (Signed)
OB Discharge Summary  Patient Name: Jennifer Villanueva DOB: 15-Feb-1991 MRN: [id number]  Date of admission: 01/21/2020 Delivering provider: Tiana Loft E   Date of discharge: 01/24/2020  Admitting diagnosis: Encounter for planned induction of labor [Z34.90] Intrauterine pregnancy: [redacted]w[redacted]d     Secondary diagnosis:Principal Problem:   Postpartum care following cesarean delivery 2/9 Active Problems:   Atrial fibrillation with RVR ([place])   Encounter for planned induction of labor   Delivery by emergency cesarean - indication: arrest of dilation   DM (diabetes mellitus) in pregnancy   Maternal anemia, with delivery  Additional problems:none     Discharge diagnosis:  Patient Active Problem List   Diagnosis Date Noted  . Maternal anemia, with delivery 01/24/2020  . Postpartum care following cesarean delivery 2/9 01/22/2020  . Delivery by emergency cesarean - indication: arrest of dilation 01/22/2020  . DM (diabetes mellitus) in pregnancy 01/22/2020  . Encounter for planned induction of labor 01/21/2020  . Atrial fibrillation with RVR ([place]) 07/08/2017  . Paroxysmal atrial fibrillation ([place]) 07/07/2017  . ASD (atrial septal defect) 07/01/2017  . GERD (gastroesophageal reflux disease) 03/05/2015  . Primary insomnia 03/05/2015                                                                Post partum procedures:none  Augmentation: Pitocin and Cytotec Pain control: Epidural  Laceration:None  Episiotomy:None  Complications: None   Hospital course:  Induction of Labor With Cesarean Section  29 y.o. yo G1P1001 at [redacted]w[redacted]d was admitted to the hospital 01/21/2020 for induction of labor. Patient had a labor course significant for progress to 7-8 cm dilation the arrest x 4 hours. The patient went for cesarean section due to Arrest of Dilation, and delivered a Viable infant,01/22/2020  Membrane Rupture Time/Date: 8:30 AM ,01/21/2020   Details of operation can be found in separate operative Note.   Patient had an uncomplicated postpartum course. She is ambulating, tolerating a regular diet, passing flatus, and urinating well.  Patient is discharged home in stable condition on 01/24/20.                                    Physical exam  Vitals:   01/23/20 2151 01/23/20 2338 01/24/20 0508 01/24/20 0830  BP: 114/71 121/67 125/71 (!) 111/58  Pulse: 93 86 92 81  Resp: 16 17 18 18   Temp: 98.3 F (36.8 C) 97.7 F (36.5 C) 98.1 F (36.7 C) 98 F (36.7 C)  TempSrc: Oral Oral Oral Oral  SpO2: 96% 98% 98% 98%  Weight:      Height:       General: alert, cooperative and no distress Lochia: appropriate Uterine Fundus: firm Incision: Dressing is clean, dry, and intact, Prevena dressing patent DVT Evaluation: No cords or calf tenderness. Calf/Ankle edema is present Labs: Lab Results  Component Value Date   WBC 12.7 (H) 01/23/2020   HGB 9.1 (L) 01/23/2020   HCT 27.8 (L) 01/23/2020   MCV 89.1 01/23/2020   PLT 148 (L) 01/23/2020   No flowsheet data found.  Vaccines: TDaP UTD         Flu    UTD  Discharge instruction: per After Visit Summary and "Baby and Me  Booklet".  After Visit Meds:  Allergies as of 01/24/2020      Reactions   Cephalosporins    Coconut Flavor    Codeine    Triple Antibiotic [bacitracin-neomycin-polymyxin]       Medication List    STOP taking these medications   aspirin 81 MG chewable tablet   clindamycin 300 MG capsule Commonly known as: CLEOCIN     TAKE these medications   acetaminophen 500 MG tablet Commonly known as: TYLENOL Take 2 tablets (1,000 mg total) by mouth every 6 (six) hours as needed for moderate pain.   coconut oil Oil Apply 1 application topically as needed.   ibuprofen 800 MG tablet Commonly known as: ADVIL Take 1 tablet (800 mg total) by mouth every 6 (six) hours.   iron polysaccharides 150 MG capsule Commonly known as: Ferrex 150 Take 1 capsule (150 mg total) by mouth daily.   loratadine 10 MG tablet Commonly known  as: CLARITIN Take 10 mg by mouth daily.   magnesium oxide 400 (241.3 Mg) MG tablet Commonly known as: MAG-OX Take 1 tablet (400 mg total) by mouth daily. Start taking on: January 25, 2020   multivitamin-prenatal 27-0.8 MG Tabs tablet Take 1 tablet by mouth daily at 12 noon.   oxyCODONE 5 MG immediate release tablet Commonly known as: Oxy IR/ROXICODONE Take 1-2 tablets (5-10 mg total) by mouth every 4 (four) hours as needed for severe pain.   propranolol 20 MG tablet Commonly known as: INDERAL Take 20 mg by mouth 3 (three) times daily.   Protonix 40 MG tablet Generic drug: pantoprazole Take 40 mg by mouth at bedtime.   senna-docusate 8.6-50 MG tablet Commonly known as: Senokot-S Take 2 tablets by mouth daily. Start taking on: January 25, 2020   simethicone 80 MG chewable tablet Commonly known as: MYLICON Chew 1 tablet (80 mg total) by mouth 3 (three) times daily after meals.            Discharge Care Instructions  (From admission, onward)         Start     Ordered   01/24/20 0000  Discharge wound care:    Comments: Remove dressing 1 week after delivery, leave open to air.   01/24/20 1146          Diet: carb modified diet  Activity: Advance as tolerated. Pelvic rest for 6 weeks.   Postpartum contraception: Not Discussed  Newborn Data: Live born female  Birth Weight: 7 lb 14.1 oz (3575 g) APGAR: 8, 9  Newborn Delivery   Birth date/time: 01/22/2020 04:18:00 Delivery type: C-Section, Low Transverse Trial of labor: Yes C-section categorization: Primary      named [name] Baby Feeding: Breast Disposition:home with mother   Delivery Report:  Review the Delivery Report for details.    Follow up: Follow-up Information    Amado Nash Candace Gallus, MD. Schedule an appointment as soon as possible for a visit in 1 week(s).   Specialty: Obstetrics and Gynecology Why: BP check and dressing removal, may schedule virtual visit d/t remote residence Contact  information: [address] [place] [address] [phone]             Signed: Neta Mends, CNM, MSN 01/24/2020, 11:48 AM

## 2020-08-24 IMAGING — US US MFM OB DETAIL+14 WK
1 series · 12 of 28 positions shown · non-contrast
Comparison: none

[Series 1: us mfm ob detail+14 wk · 90 acquisitions, 12 frames shown]
[im 4/90]
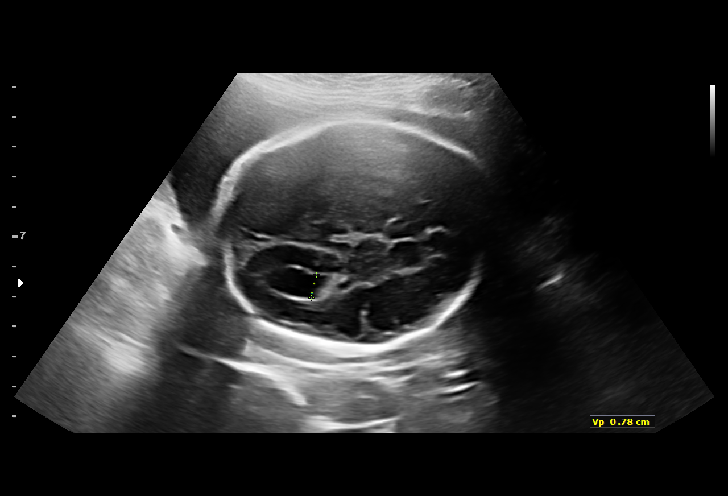
[im 10/90]
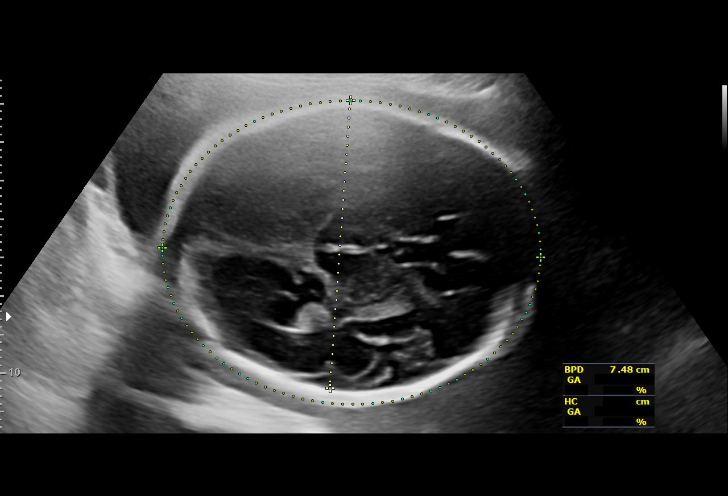
[im 17/90]
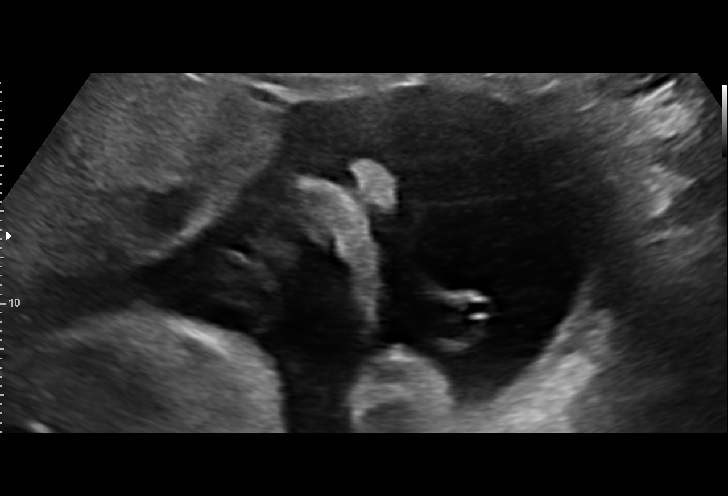
[im 27/90]
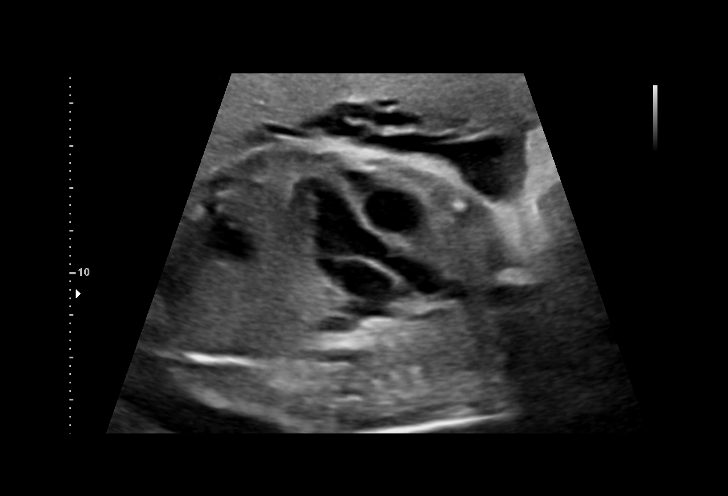
[im 33/90]
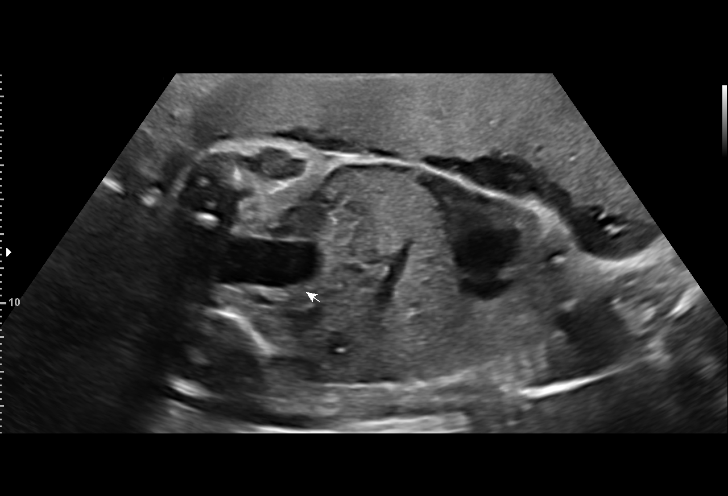
[im 40/90]
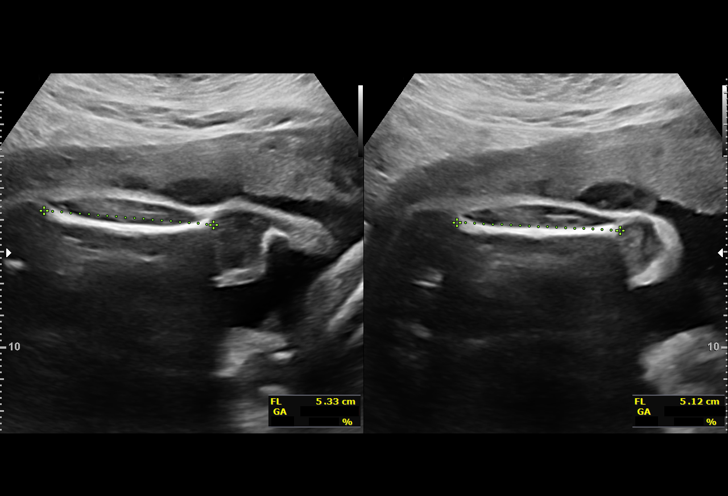
[im 50/90]
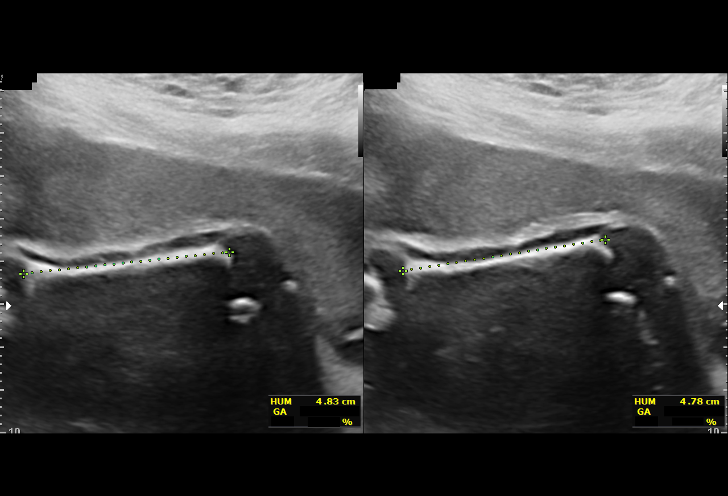
[im 57/90]
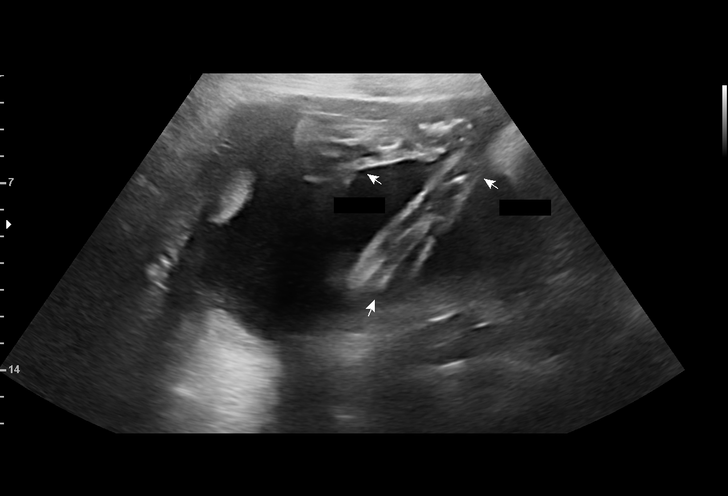
[im 63/90]
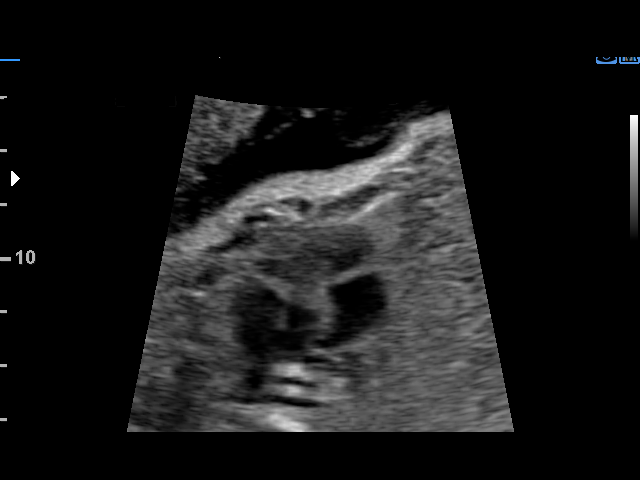
[im 73/90]
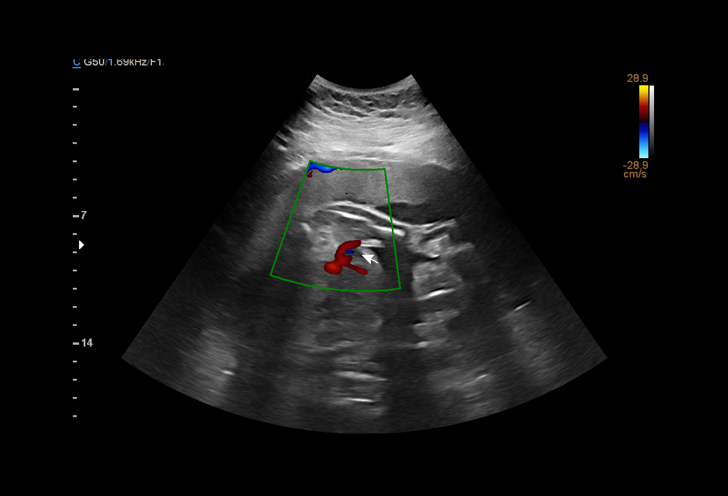
[im 80/90]
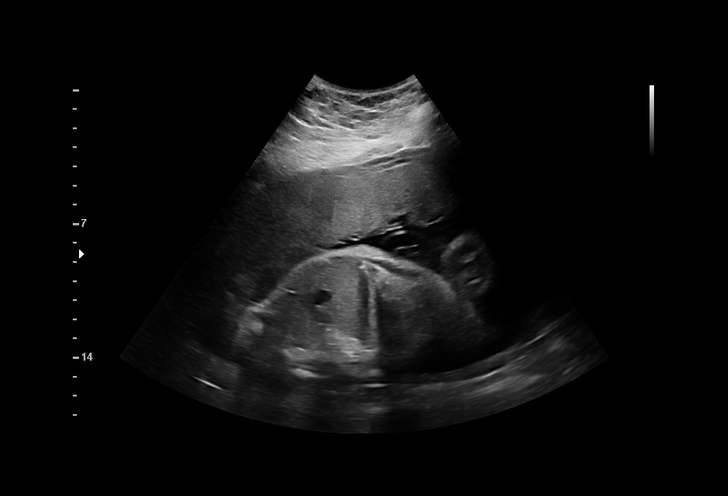
[im 86/90]
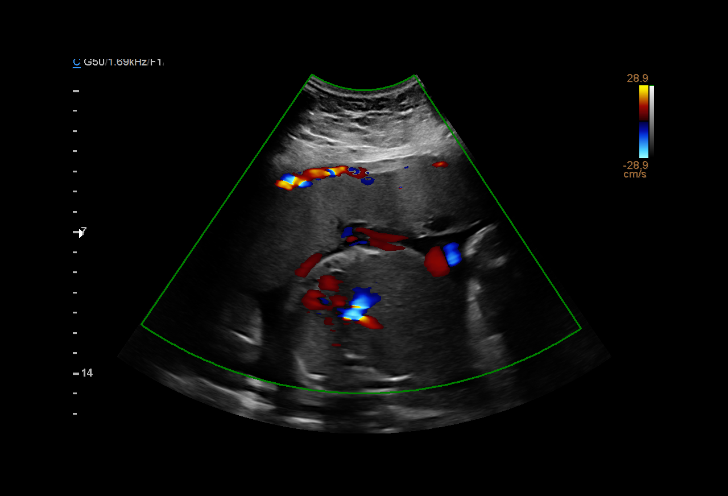

[12 of 28 positions shown; findings below may reference images not displayed]

[id number] [HOSPITAL]

 ----------------------------------------------------------------------

 ----------------------------------------------------------------------
Indications

  Maternal morbid obesity (Pre Pregnancy BMI
  41.6)
  28 weeks gestation of pregnancy
  Encounter for antenatal screening for
  malformations (low risk NIPS per PNR
  06/14/19, neg AFP)
  Gestational diabetes in pregnancy, diet
  controlled (ECHO performed 09/27/19)
  Other abnormal finding on antenatal
  screening of mother (Pt with A.Fib and ??
  Pulmonary HTN; Atrial Septum defect with
  repair 5622)
 ----------------------------------------------------------------------
Vital Signs

                                                Height:        5'5"
Fetal Evaluation

 Num Of Fetuses:         1
 Fetal Heart Rate(bpm):  153
 Cardiac Activity:       Observed
 Presentation:           Cephalic
 Placenta:               Anterior
 P. Cord Insertion:      Visualized

 Amniotic Fluid
 AFI FV:      Within normal limits

 AFI Sum(cm)     %Tile       Largest Pocket(cm)
 20.12           80

 RUQ(cm)       RLQ(cm)       LUQ(cm)        LLQ(cm)

Biometry

 BPD:      74.7  mm     G. Age:  30w 0d         89  %    CI:        71.68   %    70 - 86
                                                         FL/HC:      18.0   %    18.8 -
 HC:      280.9  mm     G. Age:  30w 5d         91  %    HC/AC:      1.10        1.05 -
 AC:       256   mm     G. Age:  29w 5d         87  %    FL/BPD:     67.9   %    71 - 87
 FL:       50.7  mm     G. Age:  27w 1d         13  %    FL/AC:      19.8   %    20 - 24
 HUM:      48.4  mm     G. Age:  28w 3d         51  %
 CER:      34.6  mm     G. Age:  29w 6d         78  %

 LV:        7.8  mm
 CM:        4.1  mm

 Est. FW:    3614  gm    2 lb 15 oz      72  %
OB History

 Gravidity:    1
Gestational Age

 Clinical EDD:  28w 1d                                        EDD:   01/24/20
 U/S Today:     29w 3d                                        EDD:   01/15/20
 Best:          28w 1d     Det. By:  Clinical EDD             EDD:   01/24/20
Anatomy

 Cranium:               Appears normal         LVOT:                   Appears normal
 Cavum:                 Appears normal         Aortic Arch:            Not well visualized
 Ventricles:            Appears normal         Ductal Arch:            Appears normal
 Choroid Plexus:        Appears normal         Diaphragm:              Appears normal
 Cerebellum:            Appears normal         Stomach:                Appears normal, left
                                                                       sided
 Posterior Fossa:       Appears normal         Abdomen:                Appears normal
 Nuchal Fold:           Not applicable (>20    Abdominal Wall:         Not well visualized
                        wks GA)
 Face:                  Appears normal         Cord Vessels:           Appears normal (3
                        (orbits and profile)                           vessel cord)
 Lips:                  Appears normal         Kidneys:                Appear normal
 Palate:                Not well visualized    Bladder:                Appears normal
 Thoracic:              Appears normal         Spine:                  Not well visualized
 Heart:                 Appears normal         Upper Extremities:      Appears normal
                        (4CH, axis, and
                        situs)
 RVOT:                  Appears normal         Lower Extremities:      Appears normal

 Other:  Heels visualized.
Cervix Uterus Adnexa

 Cervix
 Not visualized (advanced GA >99wks)
Impression

 On ultrasound, amniotic fluid is normal good fetal activity
 seen.  Fetal growth is appropriate for gestational age.  Fetal
 anatomy appears normal but limited by advanced gestational
 age.

 xxxxxxxxxxxxxxxxxxxxxxxxxxxxxxxxxxxxxxxxx
 Maternal-Fetal Medicine (Follow-up Consultation) from [REDACTED]

 Name: Kiri Jim
 MRN: [id number]
 Requesting Provider: Dr. Moncivais, Nitza

 YUMI had the pleasure of seeing Ms. Ceejay today at the Center
 for maternal [HOSPITAL].  She had MFM consultation on
 07/17/2019 because of maternal cardiac disease.
 After our consultation, the patient was seen by cardiologist in
 [HOSPITAL] (Dr. Bro).  Maternal echocardiography
 performed on 08/03/2019 showed normal ejection fraction
 and no evidence of pulmonary hypertension.

 Patient does not have symptoms of shortness of breath or
 chest pain.
 On screening, she has gestational diabetes and patient
 reports that her diabetes is well controlled on diet.
 Her prenatal course has, otherwise, been uneventful.  On cell
 free fetal DNA screening, the risks of fetal aneuploidies are
 not increased.  MSAFP screening showed low risk for open
 neural tube defects.

 I reassured the patient of today's ultrasound findings.

 I counseled the patient on the following:
 Maternal cardiac condition:
 In the absence of pulmonary hypertension, we would expect
 good maternal outcomes.  I do not expect third trimester or
 labor complications.  Patient can have vaginal delivery and
 early epidural analgesia may be helpful.  Adequate descent
 of the head should be allowed to achieve vaginal delivery
 forceps or vacuum should not be applied routinely.
 It is reasonable to deliver at 39 to 40 weeks.
 Patient has a follow-up with her cardiologist and for a repeat
 echocardiography.

 Gestational diabetes:
 I briefly discussed diabetes in pregnancy.  Well-controlled
 diabetes not associated with adverse fetal or neonatal
 outcomes.  If blood glucose levels are normal, she can check
 her glucose levels twice a week instead of daily.  Fetal growth
 assessment should be performed every 4 weeks till delivery.
Recommendations

 -Follow-up with cardiologist.
 -Fetal growth assessment every 4 weeks till delivery.
 -Delivery at [HOSPITAL].
 -Early epidural analgesia.
 -Postpartum screening for type 2 diabetes.
                 Roman, Silas
